# Patient Record
Sex: Female | Born: 1960 | Race: Black or African American | Hispanic: No | Marital: Married | Smoking: Never smoker
Health system: Southern US, Community
[De-identification: ages and names within clinical notes are randomized; demographics above are authoritative.]

## PROBLEM LIST (undated history)

## (undated) ENCOUNTER — Emergency Department (HOSPITAL_BASED_OUTPATIENT_CLINIC_OR_DEPARTMENT_OTHER): Admission: EM | Payer: Managed Care, Other (non HMO) | Source: Home / Self Care

## (undated) DIAGNOSIS — Z5189 Encounter for other specified aftercare: Secondary | ICD-10-CM

## (undated) DIAGNOSIS — A609 Anogenital herpesviral infection, unspecified: Secondary | ICD-10-CM

## (undated) DIAGNOSIS — J45909 Unspecified asthma, uncomplicated: Secondary | ICD-10-CM

## (undated) DIAGNOSIS — I10 Essential (primary) hypertension: Secondary | ICD-10-CM

## (undated) DIAGNOSIS — D649 Anemia, unspecified: Secondary | ICD-10-CM

## (undated) HISTORY — DX: Essential (primary) hypertension: I10

## (undated) HISTORY — PX: REDUCTION MAMMAPLASTY: SUR839

## (undated) HISTORY — DX: Unspecified asthma, uncomplicated: J45.909

## (undated) HISTORY — DX: Encounter for other specified aftercare: Z51.89

## (undated) HISTORY — PX: BREAST SURGERY: SHX581

## (undated) HISTORY — PX: COLONOSCOPY: SHX174

## (undated) HISTORY — DX: Anogenital herpesviral infection, unspecified: A60.9

---

## 1987-04-27 HISTORY — PX: LASER ABLATION OF THE CERVIX: SHX1949

## 1987-04-27 HISTORY — PX: TUBAL LIGATION: SHX77

## 1999-01-29 ENCOUNTER — Emergency Department (HOSPITAL_COMMUNITY): Admission: EM | Admit: 1999-01-29 | Discharge: 1999-01-29 | Payer: Self-pay | Admitting: Emergency Medicine

## 2000-05-19 ENCOUNTER — Other Ambulatory Visit: Admission: RE | Admit: 2000-05-19 | Discharge: 2000-05-19 | Payer: Self-pay | Admitting: Obstetrics and Gynecology

## 2001-05-22 ENCOUNTER — Encounter: Payer: Self-pay | Admitting: Internal Medicine

## 2001-05-22 ENCOUNTER — Ambulatory Visit (HOSPITAL_COMMUNITY): Admission: RE | Admit: 2001-05-22 | Discharge: 2001-05-22 | Payer: Self-pay | Admitting: Internal Medicine

## 2001-08-17 ENCOUNTER — Other Ambulatory Visit: Admission: RE | Admit: 2001-08-17 | Discharge: 2001-08-17 | Payer: Self-pay | Admitting: Obstetrics and Gynecology

## 2003-02-05 ENCOUNTER — Other Ambulatory Visit: Admission: RE | Admit: 2003-02-05 | Discharge: 2003-02-05 | Payer: Self-pay | Admitting: Obstetrics and Gynecology

## 2004-02-12 ENCOUNTER — Other Ambulatory Visit: Admission: RE | Admit: 2004-02-12 | Discharge: 2004-02-12 | Payer: Self-pay | Admitting: Obstetrics and Gynecology

## 2005-02-18 ENCOUNTER — Other Ambulatory Visit: Admission: RE | Admit: 2005-02-18 | Discharge: 2005-02-18 | Payer: Self-pay | Admitting: Obstetrics and Gynecology

## 2005-04-30 ENCOUNTER — Encounter: Admission: RE | Admit: 2005-04-30 | Discharge: 2005-04-30 | Payer: Self-pay | Admitting: Plastic Surgery

## 2006-02-22 ENCOUNTER — Other Ambulatory Visit: Admission: RE | Admit: 2006-02-22 | Discharge: 2006-02-22 | Payer: Self-pay | Admitting: Obstetrics and Gynecology

## 2006-05-27 ENCOUNTER — Encounter: Admission: RE | Admit: 2006-05-27 | Discharge: 2006-05-27 | Payer: Self-pay | Admitting: Obstetrics and Gynecology

## 2007-07-25 ENCOUNTER — Other Ambulatory Visit: Admission: RE | Admit: 2007-07-25 | Discharge: 2007-07-25 | Payer: Self-pay | Admitting: Obstetrics and Gynecology

## 2008-08-20 ENCOUNTER — Encounter: Payer: Self-pay | Admitting: Obstetrics and Gynecology

## 2008-08-20 ENCOUNTER — Other Ambulatory Visit: Admission: RE | Admit: 2008-08-20 | Discharge: 2008-08-20 | Payer: Self-pay | Admitting: Obstetrics and Gynecology

## 2008-08-20 ENCOUNTER — Ambulatory Visit: Payer: Self-pay | Admitting: Obstetrics and Gynecology

## 2008-08-27 ENCOUNTER — Ambulatory Visit: Payer: Self-pay | Admitting: Obstetrics and Gynecology

## 2008-09-03 ENCOUNTER — Encounter: Admission: RE | Admit: 2008-09-03 | Discharge: 2008-09-03 | Payer: Self-pay | Admitting: Obstetrics and Gynecology

## 2009-08-21 ENCOUNTER — Ambulatory Visit: Payer: Self-pay | Admitting: Obstetrics and Gynecology

## 2009-08-21 ENCOUNTER — Other Ambulatory Visit: Admission: RE | Admit: 2009-08-21 | Discharge: 2009-08-21 | Payer: Self-pay | Admitting: Obstetrics and Gynecology

## 2009-09-09 ENCOUNTER — Ambulatory Visit: Payer: Self-pay | Admitting: Obstetrics and Gynecology

## 2009-10-22 ENCOUNTER — Ambulatory Visit: Payer: Self-pay | Admitting: Obstetrics and Gynecology

## 2010-02-11 ENCOUNTER — Encounter: Admission: RE | Admit: 2010-02-11 | Discharge: 2010-02-11 | Payer: Self-pay | Admitting: Obstetrics and Gynecology

## 2010-03-11 ENCOUNTER — Ambulatory Visit: Payer: Self-pay | Admitting: Obstetrics and Gynecology

## 2010-03-16 ENCOUNTER — Ambulatory Visit: Payer: Self-pay | Admitting: Obstetrics and Gynecology

## 2010-09-03 ENCOUNTER — Encounter: Payer: Self-pay | Admitting: Obstetrics and Gynecology

## 2010-10-06 ENCOUNTER — Other Ambulatory Visit: Payer: Self-pay | Admitting: Obstetrics and Gynecology

## 2010-10-06 ENCOUNTER — Other Ambulatory Visit (HOSPITAL_COMMUNITY)
Admission: RE | Admit: 2010-10-06 | Discharge: 2010-10-06 | Disposition: A | Discharge status: Home / Self Care | Payer: 59 | Source: Ambulatory Visit | Attending: Obstetrics and Gynecology | Admitting: Obstetrics and Gynecology

## 2010-10-06 ENCOUNTER — Encounter (INDEPENDENT_AMBULATORY_CARE_PROVIDER_SITE_OTHER): Payer: 59 | Admitting: Obstetrics and Gynecology

## 2010-10-06 DIAGNOSIS — Z01419 Encounter for gynecological examination (general) (routine) without abnormal findings: Secondary | ICD-10-CM

## 2010-10-06 DIAGNOSIS — Z124 Encounter for screening for malignant neoplasm of cervix: Secondary | ICD-10-CM | POA: Insufficient documentation

## 2010-10-06 DIAGNOSIS — Z833 Family history of diabetes mellitus: Secondary | ICD-10-CM

## 2010-12-23 ENCOUNTER — Other Ambulatory Visit: Payer: Self-pay | Admitting: Obstetrics and Gynecology

## 2010-12-23 DIAGNOSIS — B373 Candidiasis of vulva and vagina: Secondary | ICD-10-CM

## 2011-05-17 ENCOUNTER — Other Ambulatory Visit: Payer: Self-pay | Admitting: Obstetrics and Gynecology

## 2011-05-17 DIAGNOSIS — Z1231 Encounter for screening mammogram for malignant neoplasm of breast: Secondary | ICD-10-CM

## 2011-05-31 ENCOUNTER — Ambulatory Visit: Payer: 59

## 2011-06-02 ENCOUNTER — Ambulatory Visit
Admission: RE | Admit: 2011-06-02 | Discharge: 2011-06-02 | Disposition: A | Discharge status: Home / Self Care | Payer: 59 | Source: Ambulatory Visit | Attending: Obstetrics and Gynecology | Admitting: Obstetrics and Gynecology

## 2011-06-02 DIAGNOSIS — Z1231 Encounter for screening mammogram for malignant neoplasm of breast: Secondary | ICD-10-CM

## 2011-07-16 ENCOUNTER — Other Ambulatory Visit: Payer: Self-pay | Admitting: Obstetrics and Gynecology

## 2011-08-19 ENCOUNTER — Other Ambulatory Visit: Payer: Self-pay | Admitting: Obstetrics and Gynecology

## 2011-08-27 ENCOUNTER — Telehealth: Payer: Self-pay | Admitting: *Deleted

## 2011-08-27 NOTE — Telephone Encounter (Signed)
Pt called requesting [facility] paper filled out due to her grandmother being sick.. Explained to pt that her grandmother doctor should fill out paper work. Pt will follow up with grandmothers PCP.

## 2011-09-28 ENCOUNTER — Other Ambulatory Visit: Payer: Self-pay | Admitting: Obstetrics and Gynecology

## 2011-09-29 ENCOUNTER — Other Ambulatory Visit: Payer: Self-pay | Admitting: Obstetrics and Gynecology

## 2011-10-26 ENCOUNTER — Other Ambulatory Visit: Payer: Self-pay | Admitting: Obstetrics and Gynecology

## 2011-11-15 ENCOUNTER — Other Ambulatory Visit: Payer: Self-pay | Admitting: Obstetrics and Gynecology

## 2011-11-16 NOTE — Telephone Encounter (Signed)
RGCE is scheduled for August 12, 201.

## 2011-12-06 ENCOUNTER — Encounter: Payer: 59 | Admitting: Obstetrics and Gynecology

## 2011-12-15 ENCOUNTER — Encounter: Payer: 59 | Admitting: Obstetrics and Gynecology

## 2011-12-22 ENCOUNTER — Encounter: Payer: Self-pay | Admitting: Obstetrics and Gynecology

## 2011-12-22 ENCOUNTER — Ambulatory Visit (INDEPENDENT_AMBULATORY_CARE_PROVIDER_SITE_OTHER): Payer: 59 | Admitting: Obstetrics and Gynecology

## 2011-12-22 VITALS — BP 130/76 | Ht 64.5 in | Wt 172.0 lb

## 2011-12-22 DIAGNOSIS — B373 Candidiasis of vulva and vagina: Secondary | ICD-10-CM

## 2011-12-22 DIAGNOSIS — Z01419 Encounter for gynecological examination (general) (routine) without abnormal findings: Secondary | ICD-10-CM

## 2011-12-22 DIAGNOSIS — D259 Leiomyoma of uterus, unspecified: Secondary | ICD-10-CM

## 2011-12-22 DIAGNOSIS — N898 Other specified noninflammatory disorders of vagina: Secondary | ICD-10-CM

## 2011-12-22 DIAGNOSIS — D219 Benign neoplasm of connective and other soft tissue, unspecified: Secondary | ICD-10-CM

## 2011-12-22 LAB — WET PREP FOR TRICH, YEAST, CLUE

## 2011-12-22 MED ORDER — MEDROXYPROGESTERONE ACETATE 5 MG PO TABS: ORAL_TABLET | ORAL | Status: DC

## 2011-12-22 MED ORDER — TERCONAZOLE 0.8 % VA CREA: TOPICAL_CREAM | VAGINAL | Status: DC

## 2011-12-22 MED ORDER — ESTRADIOL 1 MG PO TABS: 1.0000 mg | ORAL_TABLET | Freq: Every day | ORAL | Status: DC

## 2011-12-22 NOTE — Patient Instructions (Signed)
Schedule pelvic ultrasound

## 2011-12-22 NOTE — Progress Notes (Signed)
Patient came to see me today for her annual GYN exam. She remains on hormone replacement therapy with excellent results for her menopausal symptoms. She does have monthly withdrawal. She had a mammogram earlier this year. She had normal bone density in our office in 2011. 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 She has had yearly normal Pap smears since then. Her last Pap smear was 2012. She is fibroids and we've been watching. She just did lab work elsewhere. She is having vulvar and vaginal itching with discharge. She also notices bumps that come and go occasionally on her vulva. She is having no unusual bleeding or pelvic pain.  Physical examination:Kim Gardner present HEENT within normal limits. Neck: Thyroid not large. No masses. Supraclavicular nodes: not enlarged. Breasts: Examined in both sitting and lying  position. No skin changes and no masses. Abdomen: Soft no guarding rebound or masses or hernia. Pelvic: External: Within normal limits. BUS: Within normal limits. Vaginal:within normal limits.vaginal discharge consistent with yeast but negative wet prep.  Good estrogen effect. No evidence of cystocele rectocele or enterocele. Cervix: clean. Uterus: 10-11 weeks size fibroids.. Adnexa: No masses but it is very difficult to adequately evaluate due to lateral spread of fibroids. Rectovaginal exam: Confirmatory and negative. Extremities: Within normal limits.  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 Fibroids #3. Menopausal symptoms #4. Yeast vaginitis  Plan: Continue yearly mammograms. Continue HRT as above. Terconazole 3 cream. Ultrasound scheduled for adequate evaluation of ovaries.The new Pap smear guidelines were discussed with the patient. No pap done.

## 2011-12-23 LAB — URINALYSIS W MICROSCOPIC + REFLEX CULTURE
Crystals: NONE SEEN
Ketones, ur: NEGATIVE mg/dL
Leukocytes, UA: NEGATIVE
Nitrite: NEGATIVE
Specific Gravity, Urine: 1.017 (ref 1.005–1.030)
Urobilinogen, UA: 0.2 mg/dL (ref 0.0–1.0)

## 2011-12-31 ENCOUNTER — Ambulatory Visit: Payer: 59 | Admitting: Obstetrics and Gynecology

## 2011-12-31 ENCOUNTER — Other Ambulatory Visit: Payer: 59

## 2012-01-18 ENCOUNTER — Other Ambulatory Visit: Payer: Self-pay | Admitting: Obstetrics and Gynecology

## 2012-01-18 ENCOUNTER — Ambulatory Visit (INDEPENDENT_AMBULATORY_CARE_PROVIDER_SITE_OTHER): Payer: 59 | Admitting: Obstetrics and Gynecology

## 2012-01-18 ENCOUNTER — Ambulatory Visit (INDEPENDENT_AMBULATORY_CARE_PROVIDER_SITE_OTHER): Payer: 59

## 2012-01-18 DIAGNOSIS — D219 Benign neoplasm of connective and other soft tissue, unspecified: Secondary | ICD-10-CM

## 2012-01-18 DIAGNOSIS — D259 Leiomyoma of uterus, unspecified: Secondary | ICD-10-CM

## 2012-01-18 DIAGNOSIS — D251 Intramural leiomyoma of uterus: Secondary | ICD-10-CM

## 2012-01-18 DIAGNOSIS — N852 Hypertrophy of uterus: Secondary | ICD-10-CM

## 2012-01-18 DIAGNOSIS — B373 Candidiasis of vulva and vagina: Secondary | ICD-10-CM

## 2012-01-18 MED ORDER — FLUCONAZOLE 150 MG PO TABS: 150.0000 mg | ORAL_TABLET | Freq: Once | ORAL | Status: DC

## 2012-01-18 NOTE — Patient Instructions (Signed)
Pickup medication for yeast infection.

## 2012-01-18 NOTE — Progress Notes (Signed)
The patient came back today for ultrasound due to large fibroids making it difficult to feel her ovaries. On ultrasound her uterus is enlarged by multiple fibroids. 6 were seen. The 2 largest are  3 cm. They are slightly larger than her last ultrasound in 2011. Her endometrial echo is 6.4 mm. Both ovaries are seen and are normal. Her cul-de-sac is free of fluid. At her last office visit she was treated with terconazole for yeast vaginitis. She got temporarily but is still itching.  Assessment: #1. Fibroids #2. Vulvar itching  Plan: Reassured about her ovaries. Diflucan 150 mg daily for 7 days.

## 2012-08-28 ENCOUNTER — Other Ambulatory Visit: Payer: Self-pay | Admitting: Obstetrics and Gynecology

## 2012-09-19 ENCOUNTER — Other Ambulatory Visit: Payer: Self-pay

## 2012-09-19 DIAGNOSIS — Z1231 Encounter for screening mammogram for malignant neoplasm of breast: Secondary | ICD-10-CM

## 2012-09-27 ENCOUNTER — Ambulatory Visit
Admission: RE | Admit: 2012-09-27 | Discharge: 2012-09-27 | Disposition: A | Discharge status: Home / Self Care | Payer: BC Managed Care – PPO | Source: Ambulatory Visit

## 2012-09-27 DIAGNOSIS — Z1231 Encounter for screening mammogram for malignant neoplasm of breast: Secondary | ICD-10-CM

## 2012-10-20 ENCOUNTER — Other Ambulatory Visit: Payer: Self-pay | Admitting: Obstetrics and Gynecology

## 2013-01-05 ENCOUNTER — Other Ambulatory Visit: Payer: Self-pay | Admitting: Obstetrics and Gynecology

## 2013-01-07 ENCOUNTER — Other Ambulatory Visit: Payer: Self-pay | Admitting: Obstetrics and Gynecology

## 2013-01-10 ENCOUNTER — Ambulatory Visit (INDEPENDENT_AMBULATORY_CARE_PROVIDER_SITE_OTHER): Payer: BC Managed Care – PPO | Admitting: Emergency Medicine

## 2013-01-10 VITALS — BP 132/82 | HR 77 | Temp 97.9°F | Resp 18 | Ht 65.0 in | Wt 181.4 lb

## 2013-01-10 DIAGNOSIS — J209 Acute bronchitis, unspecified: Secondary | ICD-10-CM

## 2013-01-10 DIAGNOSIS — J309 Allergic rhinitis, unspecified: Secondary | ICD-10-CM

## 2013-01-10 MED ORDER — FLUTICASONE PROPIONATE 50 MCG/ACT NA SUSP: 2.0000 | Freq: Every day | NASAL | Status: DC

## 2013-01-10 MED ORDER — ALBUTEROL SULFATE HFA 108 (90 BASE) MCG/ACT IN AERS: 2.0000 | INHALATION_SPRAY | RESPIRATORY_TRACT | Status: DC | PRN

## 2013-01-10 NOTE — Progress Notes (Signed)
[facility] [address] [place] [address] [phone]  Date:  01/10/2013   Name:  Katelyn Salinas   DOB:  1960/06/27   MRN:  [id number]  PCP:  No primary provider on file.    Chief Complaint: Sore Throat, Nasal Congestion and Fatigue   History of Present Illness:  Katelyn Salinas is a 52 y.o. very pleasant female patient who presents with the following:  Ill for a "couple weeks"  With nasal congestion, sore throat, hoarseness and cough and wheezing that is worsening.  No fever or chills.  No nasal drainage that she has seen nor sputum production.  Says she has recently begun to snore and she feels that she has worsened.  Has arthralgias and myalgias.  No nausea or vomiting.  No improvement with over the counter medications or other home remedies.  Denies other complaint or health concern today.   There are no active problems to display for this patient.   Past Medical History  Diagnosis Date  . Cervical dysplasia   . Fibroid   . Adenomyosis   . Hypertension   . PID (acute pelvic inflammatory disease) 1994  . Blood transfusion without reported diagnosis     Past Surgical History  Procedure Laterality Date  . Tubal ligation    . Laser ablation of the cervix    . Breast surgery      RIGHT BREAST LUMP; BREAST REDUCTION IN 2006   . Colposcopy      History  Substance Use Topics  . Smoking status: Never Smoker   . Smokeless tobacco: Not on file  . Alcohol Use: No    Family History  Problem Relation Age of Onset  . Hypertension Mother   . Hypertension Father   . Heart disease Father   . Hypertension Brother   . Heart disease Brother     No Known Allergies  Medication list has been reviewed and updated.  Current Outpatient Prescriptions on File Prior to Visit  Medication Sig Dispense Refill  . estradiol (ESTRACE) 1 MG tablet TAKE 1 TABLET EVERY DAY  30 tablet  1  . estradiol (ESTRACE) 1 MG tablet TAKE 1 TABLET DAILY  30 tablet  0  . fish  oil-omega-3 fatty acids 1000 MG capsule Take 1 g by mouth daily.      . medroxyPROGESTERone (PROVERA) 5 MG tablet TAKE ONE TABLET DAILY FIRST 12 DAYS OF MONTH  12 tablet  0  . Multiple Vitamin (MULTIVITAMIN PO) Take by mouth.        . naproxen sodium (ANAPROX) 550 MG tablet TAKE 1 TABLET THREE TIMES A DAY  30 tablet  2  . terconazole (TERAZOL 3) 0.8 % vaginal cream One applicatorful at bedtime in vagina for 3 days  20 g  1  . Valsartan (DIOVAN PO) Take by mouth.        . fluconazole (DIFLUCAN) 150 MG tablet Take 1 tablet (150 mg total) by mouth once.  7 tablet  0  . vitamin E (VITAMIN E) 400 UNIT capsule Take 400 Units by mouth daily.         No current facility-administered medications on file prior to visit.    Review of Systems:  As per HPI, otherwise negative.   Physical Examination: Filed Vitals:   01/10/13 0824  BP: 132/82  Pulse: 77  Temp: 97.9 F (36.6 C)  Resp: 18   Filed Vitals:   01/10/13 0824  Height: 5\' 5"  (1.651 m)  Weight: 181 lb 6.4 oz (82.283 kg)   Body mass index is 30.19 kg/(m^2). Ideal Body Weight: Weight in (lb) to have BMI = 25: 149.9  GEN: WDWN, NAD, Non-toxic, A & O x 3 HEENT: Atraumatic, Normocephalic. Neck supple. No masses, No LAD. Ears and Nose: No external deformity.  Pallid swollen mucosa CV: RRR, No M/G/R. No JVD. No thrill. No extra heart sounds. PULM: CTA B, no wheezes, crackles, rhonchi. No retractions. No resp. distress. No accessory muscle use. ABD: S, NT, ND, +BS. No rebound. No HSM. EXTR: No c/c/e NEURO Normal gait.  PSYCH: Normally interactive. Conversant. Not depressed or anxious appearing.  Calm demeanor.    Assessment and Plan: Season allergic rhinitis Allergic bronchospasm Albuterol flonase  Signed,  Phillips Odor, MD

## 2013-01-10 NOTE — Patient Instructions (Addendum)
Metered Dose Inhaler (No Spacer Used) Inhaled medicines are the basis of asthma treatment and other breathing problems. Inhaled medicine can only be effective if used properly. Good technique assures that the medicine reaches the lungs. Metered dose inhalers (MDIs) are used to deliver a variety of inhaled medicines. These include quick relief medicines, controller medicines (such as corticosteroids), and cromolyn. The medicine is delivered by pushing down on a metal canister to release a set amount of spray.  If you are using different kinds of inhalers, use your quick relief medicine to open the airways 10 to 15 minutes before using a steroid. If you are unsure which inhalers to use and the order of using them, ask your caregiver, nurse, or respiratory therapist. HOW TO USE THE INHALER 1. Remove cap from inhaler. 2. Shake inhaler for 5 seconds before each inhalation (breathing in). 3. Position the inhaler so that the top of the canister faces up. 4. Put your index finger on the top of the medication canister. Your thumb supports the bottom of the inhaler. 5. Open your mouth. 6. Hold the inhaler 1 to 2 inches away from your open mouth. This allows the medicine to slow down before the medicine enters the mouth. 7. Exhale (breathe out) normally and as completely as possible. 8. Press the canister down with the index finger to release the medication. 9. At the same time as the canister is pressed, inhale deeply and slowly until the lungs are completely filled. This should take 4 to 6 seconds. Keep your tongue down. 10. Hold the medication in your lungs for up to 10 seconds (10 seconds is best). This helps the medicine get into the small airways of your lungs to work better. 11. Breathe out slowly, through pursed lips. Whistling is an example of pursed lips. 12. Wait at least 1 minute between puffs. Continue with the above steps until you have taken the number of puffs your caregiver has  ordered. 13. Replace cap on inhaler. AVOID:  Inhaling before or after starting the spray of medicine. It takes practice to coordinate your breathing with triggering the spray.  Inhaling through the nose (rather than the mouth) when triggering the spray. HOW TO DETERMINE IF YOUR INHALER IS FULL OR NEARLY EMPTY:  Determine when an inhaler is empty. You cannot know when an MDI canister is empty by shaking it. A few MDIs are now being made with dose counters. Ask your caregiver for a prescription that has a dose counter if you feel you need that extra help.  If your inhaler does not have a counter, check the number of doses in the inhaler before you use it. The canister or box will list the number of doses in the canister. Divide the total number of doses in the canister by the number you will use each day to find how many days the canister will last. (For example, if your canister has 200 doses and you take 2 puffs, 4 times each day, which is 8 puffs a day. Dividing 200 by 8 equals 25. The canister should last 25 days.) Using a calendar, count forward that many days to see when your inhaler will run out. Write the refill date on a calendar or your canister.  Remember, if you need to take extra doses, the inhaler will empty sooner than you figured. Be sure you have a refill before your canister runs out. Refill your inhaler 7 to 10 days before it runs out. HOME CARE INSTRUCTIONS   Do   not use the inhaler more than your caregiver tells you. If you are still wheezing and are feeling tightness in your chest, call your caregiver.  Keep an adequate supply of medication. This includes making sure the medicine is not expired, and you have a spare MDI.  Follow your caregiver or inhaler insert directions for cleaning the inhaler. SEEK MEDICAL CARE IF:   Symptoms are only partially relieved with your inhalers.  You are having trouble using your inhalers.  You experience some increase in phlegm.  You  develop a fever of 102 F (38.9 C). SEEK IMMEDIATE MEDICAL CARE IF:   You feel little or no relief with your inhalers. You are still wheezing and are feeling shortness of breath and/or tightness in your chest.  You have side effects such as dizziness, headaches, or fast heart rate.  You have chills, fever, night sweats or an oral temperature above 102 F (38.9 C) develops.  Phlegm production increases a lot, or there is blood in the phlegm. MAKE SURE YOU:   Understand these instructions.  Will watch your condition.  Will get help right away if you are not doing well or get worse. Document Released: 02/07/2007 Document Revised: 07/05/2011 Document Reviewed: 01/28/2009 [employer] Patient Information 2014 [employer].  

## 2013-01-16 ENCOUNTER — Ambulatory Visit (INDEPENDENT_AMBULATORY_CARE_PROVIDER_SITE_OTHER): Payer: BC Managed Care – PPO | Admitting: Gynecology

## 2013-01-16 ENCOUNTER — Encounter: Payer: Self-pay | Admitting: Gynecology

## 2013-01-16 ENCOUNTER — Telehealth: Payer: Self-pay | Admitting: *Deleted

## 2013-01-16 VITALS — BP 130/80 | Ht 64.5 in | Wt 179.0 lb

## 2013-01-16 DIAGNOSIS — D259 Leiomyoma of uterus, unspecified: Secondary | ICD-10-CM

## 2013-01-16 DIAGNOSIS — Z01419 Encounter for gynecological examination (general) (routine) without abnormal findings: Secondary | ICD-10-CM

## 2013-01-16 DIAGNOSIS — N951 Menopausal and female climacteric states: Secondary | ICD-10-CM

## 2013-01-16 DIAGNOSIS — Z79890 Hormone replacement therapy (postmenopausal): Secondary | ICD-10-CM

## 2013-01-16 DIAGNOSIS — Z87410 Personal history of cervical dysplasia: Secondary | ICD-10-CM

## 2013-01-16 DIAGNOSIS — Z23 Encounter for immunization: Secondary | ICD-10-CM

## 2013-01-16 DIAGNOSIS — B373 Candidiasis of vulva and vagina: Secondary | ICD-10-CM

## 2013-01-16 DIAGNOSIS — B3731 Acute candidiasis of vulva and vagina: Secondary | ICD-10-CM

## 2013-01-16 HISTORY — DX: Leiomyoma of uterus, unspecified: D25.9

## 2013-01-16 MED ORDER — TERCONAZOLE 0.8 % VA CREA: TOPICAL_CREAM | VAGINAL | Status: DC

## 2013-01-16 MED ORDER — MEDROXYPROGESTERONE ACETATE 5 MG PO TABS: ORAL_TABLET | ORAL | Status: DC

## 2013-01-16 MED ORDER — ESTRADIOL 1 MG PO TABS: ORAL_TABLET | ORAL | Status: DC

## 2013-01-16 MED ORDER — ESTRADIOL 2 MG PO TABS: ORAL_TABLET | ORAL | Status: DC

## 2013-01-16 NOTE — Telephone Encounter (Signed)
Pharmacy called regarding estradiol 2 mg that was sent today. I told him that your note said "increase Estrace to 1.5 mg daily" he said that the Rx should be estradiol 1 mg take 1 1/2 tablets daily #45 once daily. Pharmacist asked me to send this why, please approve.

## 2013-01-16 NOTE — Patient Instructions (Addendum)

## 2013-01-16 NOTE — Telephone Encounter (Signed)
That is correct 

## 2013-01-16 NOTE — Telephone Encounter (Signed)
rx sent to pharmacy

## 2013-01-16 NOTE — Addendum Note (Signed)
Addended by: Bertram Savin A on: 01/16/2013 11:39 AM   Modules accepted: Orders

## 2013-01-16 NOTE — Progress Notes (Signed)
Katelyn Salinas 1960/06/30 [id number]   History:    52 y.o.  for annual gyn exam who went into menopause approximately 2 years ago and has been on Estrace 1 mg daily with the addition of Provera 5 mg by mouth daily for 12 days of the month. Patient stating that her vasomotor symptoms especially night sweats in the evening have become unbearable and contributes to her insomnia. Patient has a normal like menstrual cycle every 28-30 days. 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 Her Pap smear subsequently had been normal. Patient does have history of fibroid uterus last years ultrasound demonstrated a somewhat slightly gotten bigger and has a total of 6 fibroids. Patient's last bone density study was in 2011. Patient's mammogram this year was normal as well. Patient has not had her colonoscopy. Patient has not received her Tdap vaccine yet. Seen yet. Her PCP is Dr.Kohut who has been drawing her lab work.  Past medical history,surgical history, family history and social history were all reviewed and documented in the EPIC chart.  Gynecologic History Patient's last menstrual period was 12/31/2012. Contraception: post menopausal status Last Pap: 2012. Results were: normal Last mammogram: 2014. Results were: normal  Obstetric History OB History  Gravida Para Term Preterm AB SAB TAB Ectopic Multiple Living  1 1 1       1     # Outcome Date GA Lbr Len/2nd Weight Sex Delivery Anes PTL Lv  1 TRM                ROS: A ROS was performed and pertinent positives and negatives are included in the history.  GENERAL: No fevers or chills. HEENT: No change in vision, no earache, sore throat or sinus congestion. NECK: No pain or stiffness. CARDIOVASCULAR: No chest pain or pressure. No palpitations. PULMONARY: No shortness of breath, cough or wheeze. GASTROINTESTINAL: No abdominal pain, nausea, vomiting or diarrhea, melena or  bright red blood per rectum. GENITOURINARY: No urinary frequency, urgency, hesitancy or dysuria. MUSCULOSKELETAL: No joint or muscle pain, no back pain, no recent trauma. DERMATOLOGIC: No rash, no itching, no lesions. ENDOCRINE: No polyuria, polydipsia, no heat or cold intolerance. No recent change in weight. HEMATOLOGICAL: No anemia or easy bruising or bleeding. NEUROLOGIC: No headache, seizures, numbness, tingling or weakness. PSYCHIATRIC: No depression, no loss of interest in normal activity or change in sleep pattern.     Exam: chaperone present  BP 130/80  Ht 5' 4.5" (1.638 m)  Wt 179 lb (81.194 kg)  BMI 30.26 kg/m2  LMP 12/31/2012  Body mass index is 30.26 kg/(m^2).  General appearance : Well developed well nourished female. No acute distress HEENT: Neck supple, trachea midline, no carotid bruits, no thyroidmegaly Lungs: Clear to auscultation, no rhonchi or wheezes, or rib retractions  Heart: Regular rate and rhythm, no murmurs or gallops Breast:Examined in sitting and supine position were symmetrical in appearance, no palpable masses or tenderness,  no skin retraction, no nipple inversion, no nipple discharge, no skin discoloration, no axillary or supraclavicular lymphadenopathy Abdomen: no palpable masses or tenderness, no rebound or guarding Extremities: no edema or skin discoloration or tenderness  Pelvic:  Bartholin, Urethra, Skene Glands: Within normal limits             Vagina: No gross lesions or discharge  Cervix: No gross lesions or discharge  Uterus   Irregular shaped 10 week size  Adnexa  Difficult  to assess due to the uterine size  Anus and perineum  normal   Rectovaginal  normal sphincter tone without palpated masses or tenderness             Hemoccult course provided     Assessment/Plan:  52 y.o. female for annual exam with history of fibroid uterus. Today's pelvic exam incomplete due to size of uterus. Patient returned to the office in the next several weeks  for an ultrasound. Because of patient's worsening vasomotor symptoms and we will increase her Estrace to 1.5 mg daily with the addition of Provera 10 mg daily for 10 days of the month. We discussed the importance of calcium and vitamin D in regular exercise for osteoporosis prevention. She will need a bone density study next year. Pap smear not done today new guidelines were discussed. She was given Hemoccult card system into the office for testing. She was reminded to schedule her colonoscopy this year. Patient received a Tdap vaccine today and literature information was provided.    Ok Edwards MD, 11:32 AM 01/16/2013

## 2013-01-29 DIAGNOSIS — Z0271 Encounter for disability determination: Secondary | ICD-10-CM

## 2013-03-30 ENCOUNTER — Other Ambulatory Visit: Payer: Self-pay | Admitting: Emergency Medicine

## 2013-03-30 DIAGNOSIS — N63 Unspecified lump in unspecified breast: Secondary | ICD-10-CM

## 2013-03-30 DIAGNOSIS — N644 Mastodynia: Secondary | ICD-10-CM

## 2013-04-02 ENCOUNTER — Other Ambulatory Visit: Payer: Self-pay | Admitting: Endocrinology

## 2013-04-02 DIAGNOSIS — N644 Mastodynia: Secondary | ICD-10-CM

## 2013-04-02 DIAGNOSIS — N63 Unspecified lump in unspecified breast: Secondary | ICD-10-CM

## 2013-04-03 ENCOUNTER — Ambulatory Visit
Admission: RE | Admit: 2013-04-03 | Discharge: 2013-04-03 | Disposition: A | Discharge status: Home / Self Care | Payer: BC Managed Care – PPO | Source: Ambulatory Visit | Attending: Emergency Medicine | Admitting: Emergency Medicine

## 2013-04-03 ENCOUNTER — Other Ambulatory Visit: Payer: Self-pay | Admitting: Endocrinology

## 2013-04-03 DIAGNOSIS — N63 Unspecified lump in unspecified breast: Secondary | ICD-10-CM

## 2013-04-03 DIAGNOSIS — N644 Mastodynia: Secondary | ICD-10-CM

## 2013-04-04 ENCOUNTER — Ambulatory Visit (INDEPENDENT_AMBULATORY_CARE_PROVIDER_SITE_OTHER): Payer: BC Managed Care – PPO

## 2013-04-04 ENCOUNTER — Ambulatory Visit (INDEPENDENT_AMBULATORY_CARE_PROVIDER_SITE_OTHER): Payer: BC Managed Care – PPO | Admitting: Gynecology

## 2013-04-04 ENCOUNTER — Telehealth (INDEPENDENT_AMBULATORY_CARE_PROVIDER_SITE_OTHER): Payer: Self-pay

## 2013-04-04 ENCOUNTER — Other Ambulatory Visit: Payer: Self-pay | Admitting: Gynecology

## 2013-04-04 DIAGNOSIS — D259 Leiomyoma of uterus, unspecified: Secondary | ICD-10-CM

## 2013-04-04 DIAGNOSIS — N83209 Unspecified ovarian cyst, unspecified side: Secondary | ICD-10-CM

## 2013-04-04 DIAGNOSIS — D251 Intramural leiomyoma of uterus: Secondary | ICD-10-CM

## 2013-04-04 DIAGNOSIS — N852 Hypertrophy of uterus: Secondary | ICD-10-CM

## 2013-04-04 DIAGNOSIS — N83201 Unspecified ovarian cyst, right side: Secondary | ICD-10-CM

## 2013-04-04 DIAGNOSIS — N8300 Follicular cyst of ovary, unspecified side: Secondary | ICD-10-CM

## 2013-04-04 HISTORY — DX: Unspecified ovarian cyst, right side: N83.201

## 2013-04-04 NOTE — Progress Notes (Signed)
   Patient is a 52 year old who was seen in the office for the first time in over 2 year time span for annual gynecological exam. Patient was complaining of vasomotor symptoms and her estrogen was increased from 1 mg to 1.5 mg daily with the addition of Provera 10 mg for 10 days of the month which he states that since that last office visit this has helped tremendously and is working well. She was asked to come in today for followup ultrasound since she does have a history of a fibroid uterus. She has otherwise been asymptomatic.  Ultrasound today: Uterus measured 10.7 x 7.3 x 6.9 cm with an endometrial stripe of 10.4 mm (patient on HRT). Patient with 5 small fibroids the largest one measuring 29.3 mm. Right ovary small follicle measuring 22 x 22 x 21 mm echo for a period left ovary was normal.  Assessment/plan: Patient with uterine fibroids appear to have started to decrease in comparison with prior ultrasound in 2013. A small benign appearing ovarian follicle which was discussed with the patient reassurance given. She'll return back to the office next year for her annual exam or when necessary.

## 2013-04-04 NOTE — Telephone Encounter (Signed)
Called and left message for pt to call me.  She is scheduled to see Dr Derrell Lolling tomorrow.  I saw she is scheduled for a needle bx 12/18.  She can still come in tomorrow 1st or wait until after bx.

## 2013-04-05 ENCOUNTER — Encounter (INDEPENDENT_AMBULATORY_CARE_PROVIDER_SITE_OTHER): Payer: Self-pay | Admitting: General Surgery

## 2013-04-05 ENCOUNTER — Ambulatory Visit (INDEPENDENT_AMBULATORY_CARE_PROVIDER_SITE_OTHER): Payer: BC Managed Care – PPO | Admitting: General Surgery

## 2013-04-05 VITALS — BP 128/72 | HR 60 | Temp 98.0°F | Resp 18 | Ht 65.0 in | Wt 182.0 lb

## 2013-04-05 DIAGNOSIS — R2231 Localized swelling, mass and lump, right upper limb: Secondary | ICD-10-CM

## 2013-04-05 DIAGNOSIS — R2230 Localized swelling, mass and lump, unspecified upper limb: Secondary | ICD-10-CM | POA: Insufficient documentation

## 2013-04-05 DIAGNOSIS — R229 Localized swelling, mass and lump, unspecified: Secondary | ICD-10-CM

## 2013-04-05 NOTE — Patient Instructions (Signed)
The small swollen area in your right axilla feels soft like benign fatty tissue, or less likely, benign breast tissue. It does not feel like an abnormal lymph node and does not feel like a malignancy.  I agree with the scheduled image guided biopsy.  Return to see Dr. Derrell Lolling in 2-3 weeks for discussion of biopsy report

## 2013-04-05 NOTE — Progress Notes (Signed)
Patient ID: Katelyn Salinas, female   DOB: 1960-07-19, 52 y.o.   MRN: [id number]  Chief Complaint  Patient presents with  . New Evaluation    rt axillary swellling    HPI Katelyn Salinas is a 52 y.o. female.  She is referred by Dr. Maurine Minister, for evaluation of a right axillary mass.  The patient gives a six-month history of a tender swelling in the right axilla. She says that the tenderness waxes and wanes but the size of the lump has stayed about the same. No prior history of this. Recent right mammogram and ultrasound suggested there might be some right axillary breast tissue, possibly ectopic, or just adipose tissue. She is scheduled for and image guided biopsy of the right axilla mass by the radiologist on December 18.  She is taking hormone replacement therapy, and this is cyclical and she has menstrual periods. She is followed by Dr. Lily Peer. She has had one pregnancy and one delivery and also has some stepchildren.  She's had bilateral breast reductions by Dr. Stephens November  in 2007 and she had a cyst excised from her right breast in the past. She has mild asthma and hypertension.  History reveals a paternal great grandmother that had breast cancer but no other family history of breast or ovarian cancer.  She denies use of tobacco or alcohol. She is married.  HPI  Past Medical History  Diagnosis Date  . Cervical dysplasia   . Fibroid   . Adenomyosis   . Hypertension   . PID (acute pelvic inflammatory disease) 1994  . Blood transfusion without reported diagnosis   . Asthma     Past Surgical History  Procedure Laterality Date  . Tubal ligation    . Laser ablation of the cervix    . Breast surgery      RIGHT BREAST LUMP; BREAST REDUCTION IN 2006   . Colposcopy      Family History  Problem Relation Age of Onset  . Hypertension Mother   . Hypertension Father   . Heart disease Father   . Hypertension Brother   . Heart disease Brother     Social History History  Substance  Use Topics  . Smoking status: Never Smoker   . Smokeless tobacco: Not on file  . Alcohol Use: No    No Known Allergies  Current Outpatient Prescriptions  Medication Sig Dispense Refill  . albuterol (PROVENTIL HFA;VENTOLIN HFA) 108 (90 BASE) MCG/ACT inhaler Inhale 2 puffs into the lungs every 4 (four) hours as needed for wheezing (cough, shortness of breath or wheezing.).  1 Inhaler  1  . cephALEXin (KEFLEX) 500 MG capsule Take 500 mg by mouth 4 (four) times daily.      . citalopram (CELEXA) 20 MG tablet Take 20 mg by mouth daily.      Marland Kitchen estradiol (ESTRACE) 1 MG tablet Take 1 1/2 tablets daily by mouth  45 tablet  11  . fish oil-omega-3 fatty acids 1000 MG capsule Take 1 g by mouth daily.      . fluticasone (FLONASE) 50 MCG/ACT nasal spray Place 2 sprays into the nose daily.  16 g  12  . medroxyPROGESTERone (PROVERA) 5 MG tablet Take one daily for 10 days of the month  30 tablet  4  . Multiple Vitamin (MULTIVITAMIN PO) Take by mouth.        . naproxen sodium (ANAPROX) 550 MG tablet TAKE 1 TABLET THREE TIMES A DAY  30 tablet  2  . tacrolimus (PROGRAF)  0.5 MG capsule Take 0.5 mg by mouth 2 (two) times daily.      Marland Kitchen terconazole (TERAZOL 3) 0.8 % vaginal cream One applicatorful at bedtime in vagina for 3 days  20 g  1  . Valsartan (DIOVAN PO) Take by mouth.        . vitamin E (VITAMIN E) 400 UNIT capsule Take 400 Units by mouth daily.        . fluconazole (DIFLUCAN) 150 MG tablet Take 1 tablet (150 mg total) by mouth once.  7 tablet  0   No current facility-administered medications for this visit.    Review of Systems Review of Systems  Constitutional: Negative for fever, chills and unexpected weight change.  HENT: Negative for congestion, hearing loss, sore throat, trouble swallowing and voice change.   Eyes: Negative for visual disturbance.  Respiratory: Negative for cough and wheezing.   Cardiovascular: Negative for chest pain, palpitations and leg swelling.  Gastrointestinal:  Negative for nausea, vomiting, abdominal pain, diarrhea, constipation, blood in stool, abdominal distention and anal bleeding.  Genitourinary: Negative for hematuria, vaginal bleeding and difficulty urinating.  Musculoskeletal: Negative for arthralgias.  Skin: Negative for rash and wound.  Neurological: Negative for seizures, syncope and headaches.  Hematological: Negative for adenopathy. Does not bruise/bleed easily.  Psychiatric/Behavioral: Negative for confusion.    Blood pressure 128/72, pulse 60, temperature 98 F (36.7 C), resp. rate 18, height 5\' 5"  (1.651 m), weight 182 lb (82.555 kg), last menstrual period 03/24/2013.  Physical Exam Physical Exam  Constitutional: She is oriented to person, place, and time. She appears well-developed and well-nourished. No distress.  HENT:  Head: Normocephalic and atraumatic.  Nose: Nose normal.  Mouth/Throat: No oropharyngeal exudate.  Eyes: Conjunctivae and EOM are normal. Pupils are equal, round, and reactive to light. Left eye exhibits no discharge. No scleral icterus.  Neck: Neck supple. No JVD present. No tracheal deviation present. No thyromegaly present.  Cardiovascular: Normal rate, regular rhythm, normal heart sounds and intact distal pulses.   No murmur heard. Pulmonary/Chest: Effort normal and breath sounds normal. No respiratory distress. She has no wheezes. She has no rales. She exhibits no tenderness.    Small soft tissue mass right axilla, high. This is not very impressive by size or texture. Feels like adipose tissue. Theoretically could be atopic breast tissue. Does not feel like pathologic adenopathy. Overlying skin is healthy. It is a little tender.  Abdominal: Soft. Bowel sounds are normal. She exhibits no distension and no mass. There is no tenderness. There is no rebound and no guarding.  Musculoskeletal: She exhibits no edema and no tenderness.  Lymphadenopathy:    She has no cervical adenopathy.  Neurological: She is  alert and oriented to person, place, and time. She exhibits normal muscle tone. Coordination normal.  Skin: Skin is warm. No rash noted. She is not diaphoretic. No erythema. No pallor.  Psychiatric: She has a normal mood and affect. Her behavior is normal. Judgment and thought content normal.    Data Reviewed Dr. Marylen Ponto office notes. Mammogram and ultrasound  Assessment    Right axillary mass. This feels like benign adipose tissue or benign ectopic breast tissue. History is more consistent with lipoma.  History bilateral breast reduction  History right breast cyst excision.  Hypertension  Mild asthma     Plan    I discussed the differential diagnosis with the patient. I told her that this was a relatively low risk finding. I advised against any surgical intervention at this  point in time.  She will return to see me after she gets her biopsy we can discuss the histology and its implications.        Angelia Mould. Derrell Lolling, M.D., [facility] General and Minimally invasive Surgery Breast and Colorectal Surgery Office:   [phone] Pager:   [phone]  04/05/2013, 10:14 AM

## 2013-04-12 ENCOUNTER — Other Ambulatory Visit: Payer: Self-pay | Admitting: Endocrinology

## 2013-04-12 ENCOUNTER — Ambulatory Visit
Admission: RE | Admit: 2013-04-12 | Discharge: 2013-04-12 | Disposition: A | Discharge status: Home / Self Care | Payer: BC Managed Care – PPO | Source: Ambulatory Visit | Attending: Endocrinology | Admitting: Endocrinology

## 2013-04-12 DIAGNOSIS — N644 Mastodynia: Secondary | ICD-10-CM

## 2013-04-12 DIAGNOSIS — N63 Unspecified lump in unspecified breast: Secondary | ICD-10-CM

## 2013-04-12 HISTORY — PX: BREAST BIOPSY: SHX20

## 2013-04-16 ENCOUNTER — Ambulatory Visit (INDEPENDENT_AMBULATORY_CARE_PROVIDER_SITE_OTHER): Payer: BC Managed Care – PPO | Admitting: General Surgery

## 2013-04-16 ENCOUNTER — Encounter (INDEPENDENT_AMBULATORY_CARE_PROVIDER_SITE_OTHER): Payer: Self-pay | Admitting: General Surgery

## 2013-04-16 VITALS — BP 136/82 | HR 64 | Temp 97.4°F | Resp 14 | Ht 65.0 in | Wt 181.8 lb

## 2013-04-16 DIAGNOSIS — R2231 Localized swelling, mass and lump, right upper limb: Secondary | ICD-10-CM

## 2013-04-16 DIAGNOSIS — R229 Localized swelling, mass and lump, unspecified: Secondary | ICD-10-CM

## 2013-04-16 NOTE — Progress Notes (Signed)
Patient ID: Katelyn Salinas, female   DOB: Jan 21, 1961, 52 y.o.   MRN: [id number] History: This patient returns following image guided biopsy of right axillary mass. Right axillary mass pathology shows benign breast tissue. This is an ectopic breast tissue. I discussed this with the patient in detail. I gave her a copy of the pathology report. She was informed that this might regress if she stopped her hormone therapy. I told her that there was no indication for surgery at this time. She is essentially asymptomatic.  Past history, family history, social history, and review of systems her doctor in normal chart, unchanged, and noncontributory except as described above  Exam: The patient is in no distress today. Alert. Mental status normal. I did no further exam but we spent 20 minutes discussing the problem  Assessment: Right axillary mass, proven to be benign ectopic breast tissue on image guided biopsy History bilateral breast reduction History right breast cyst excision Hypertension Mild asthma  Plan: No surgical intervention planned Patient will consider discontinuing hormone therapy Annual mammograms recommended Return to see me as needed.   Angelia Mould. Derrell Lolling, M.D., [facility] General and Minimally invasive Surgery Breast and Colorectal Surgery Office:   [phone] Pager:   [phone]

## 2013-04-16 NOTE — Patient Instructions (Signed)
Biopsy of the tissue in your right axilla shows benign breast tissue. This means that you have a small ectopic [place] of breast tissue. This should not cause any problems.  If you stop the hormone therapy, this may get smaller.  Dr. Derrell Lolling advises no further surgery at this point in time. If this area becomes painful or enlarges, return to see Dr. Derrell Lolling for further evaluation.  Be sure to get annual mammograms

## 2013-04-26 ENCOUNTER — Other Ambulatory Visit: Payer: Self-pay | Admitting: Women's Health

## 2013-07-18 ENCOUNTER — Other Ambulatory Visit: Payer: Self-pay | Admitting: Women's Health

## 2013-12-27 ENCOUNTER — Other Ambulatory Visit: Payer: Self-pay | Admitting: Women's Health

## 2014-01-03 ENCOUNTER — Other Ambulatory Visit: Payer: Self-pay | Admitting: Women's Health

## 2014-02-01 ENCOUNTER — Encounter: Payer: Self-pay | Admitting: Gynecology

## 2014-02-01 ENCOUNTER — Other Ambulatory Visit (HOSPITAL_COMMUNITY)
Admission: RE | Admit: 2014-02-01 | Discharge: 2014-02-01 | Disposition: A | Discharge status: Home / Self Care | Payer: BC Managed Care – PPO | Source: Ambulatory Visit | Attending: Gynecology | Admitting: Gynecology

## 2014-02-01 ENCOUNTER — Ambulatory Visit (INDEPENDENT_AMBULATORY_CARE_PROVIDER_SITE_OTHER): Payer: BC Managed Care – PPO | Admitting: Gynecology

## 2014-02-01 VITALS — BP 122/78 | Ht 65.0 in | Wt 177.0 lb

## 2014-02-01 DIAGNOSIS — Z01419 Encounter for gynecological examination (general) (routine) without abnormal findings: Secondary | ICD-10-CM | POA: Insufficient documentation

## 2014-02-01 DIAGNOSIS — Z79890 Hormone replacement therapy (postmenopausal): Secondary | ICD-10-CM

## 2014-02-01 DIAGNOSIS — N951 Menopausal and female climacteric states: Secondary | ICD-10-CM

## 2014-02-01 DIAGNOSIS — Z124 Encounter for screening for malignant neoplasm of cervix: Secondary | ICD-10-CM

## 2014-02-01 DIAGNOSIS — Z1151 Encounter for screening for human papillomavirus (HPV): Secondary | ICD-10-CM | POA: Insufficient documentation

## 2014-02-01 DIAGNOSIS — D251 Intramural leiomyoma of uterus: Secondary | ICD-10-CM

## 2014-02-01 MED ORDER — ESTRADIOL 1 MG PO TABS: ORAL_TABLET | ORAL | Status: DC

## 2014-02-01 MED ORDER — MEDROXYPROGESTERONE ACETATE 10 MG PO TABS: 10.0000 mg | ORAL_TABLET | Freq: Every day | ORAL | Status: DC

## 2014-02-01 NOTE — Patient Instructions (Signed)
You may obtain a copy of any labs that were done today by logging onto MyChart as outlined in the instructions provided with your AVS (after visit summary). The office will not call with normal lab results but certainly if there are any significant abnormalities then we will contact you.   Health Maintenance, Female A healthy lifestyle and preventative care can promote health and wellness.  Maintain regular health, dental, and eye exams.  Eat a healthy diet. Foods like vegetables, fruits, whole grains, low-fat dairy products, and lean protein foods contain the nutrients you need without too many calories. Decrease your intake of foods high in solid fats, added sugars, and salt. Get information about a proper diet from your caregiver, if necessary.  Regular physical exercise is one of the most important things you can do for your health. Most adults should get at least 150 minutes of moderate-intensity exercise (any activity that increases your heart rate and causes you to sweat) each week. In addition, most adults need muscle-strengthening exercises on 2 or more days a week.   Maintain a healthy weight. The body mass index (BMI) is a screening tool to identify possible weight problems. It provides an estimate of body fat based on height and weight. Your caregiver can help determine your BMI, and can help you achieve or maintain a healthy weight. For adults 20 years and older:  A BMI below 18.5 is considered underweight.  A BMI of 18.5 to 24.9 is normal.  A BMI of 25 to 29.9 is considered overweight.  A BMI of 30 and above is considered obese.  Maintain normal blood lipids and cholesterol by exercising and minimizing your intake of saturated fat. Eat a balanced diet with plenty of fruits and vegetables. Blood tests for lipids and cholesterol should begin at age 61 and be repeated every 5 years. If your lipid or cholesterol levels are high, you are over 50, or you are a high risk for heart  disease, you may need your cholesterol levels checked more frequently.Ongoing high lipid and cholesterol levels should be treated with medicines if diet and exercise are not effective.  If you smoke, find out from your caregiver how to quit. If you do not use tobacco, do not start.  Lung cancer screening is recommended for adults aged 33 80 years who are at high risk for developing lung cancer because of a history of smoking. Yearly low-dose computed tomography (CT) is recommended for people who have at least a 30-pack-year history of smoking and are a current smoker or have quit within the past 15 years. A pack year of smoking is smoking an average of 1 pack of cigarettes a day for 1 year (for example: 1 pack a day for 30 years or 2 packs a day for 15 years). Yearly screening should continue until the smoker has stopped smoking for at least 15 years. Yearly screening should also be stopped for people who develop a health problem that would prevent them from having lung cancer treatment.  If you are pregnant, do not drink alcohol. If you are breastfeeding, be very cautious about drinking alcohol. If you are not pregnant and choose to drink alcohol, do not exceed 1 drink per day. One drink is considered to be 12 ounces (355 mL) of beer, 5 ounces (148 mL) of wine, or 1.5 ounces (44 mL) of liquor.  Avoid use of street drugs. Do not share needles with anyone. Ask for help if you need support or instructions about stopping  the use of drugs.  High blood pressure causes heart disease and increases the risk of stroke. Blood pressure should be checked at least every 1 to 2 years. Ongoing high blood pressure should be treated with medicines, if weight loss and exercise are not effective.  If you are 59 to 52 years old, ask your caregiver if you should take aspirin to prevent strokes.  Diabetes screening involves taking a blood sample to check your fasting blood sugar level. This should be done once every 3  years, after age 91, if you are within normal weight and without risk factors for diabetes. Testing should be considered at a younger age or be carried out more frequently if you are overweight and have at least 1 risk factor for diabetes.  Breast cancer screening is essential preventative care for women. You should practice "breast self-awareness." This means understanding the normal appearance and feel of your breasts and may include breast self-examination. Any changes detected, no matter how small, should be reported to a caregiver. Women in their 66s and 30s should have a clinical breast exam (CBE) by a caregiver as part of a regular health exam every 1 to 3 years. After age 101, women should have a CBE every year. Starting at age 100, women should consider having a mammogram (breast X-ray) every year. Women who have a family history of breast cancer should talk to their caregiver about genetic screening. Women at a high risk of breast cancer should talk to their caregiver about having an MRI and a mammogram every year.  Breast cancer gene (BRCA)-related cancer risk assessment is recommended for women who have family members with BRCA-related cancers. BRCA-related cancers include breast, ovarian, tubal, and peritoneal cancers. Having family members with these cancers may be associated with an increased risk for harmful changes (mutations) in the breast cancer genes BRCA1 and BRCA2. Results of the assessment will determine the need for genetic counseling and BRCA1 and BRCA2 testing.  The Pap test is a screening test for cervical cancer. Women should have a Pap test starting at age 57. Between ages 25 and 35, Pap tests should be repeated every 2 years. Beginning at age 37, you should have a Pap test every 3 years as long as the past 3 Pap tests have been normal. If you had a hysterectomy for a problem that was not cancer or a condition that could lead to cancer, then you no longer need Pap tests. If you are  between ages 50 and 76, and you have had normal Pap tests going back 10 years, you no longer need Pap tests. If you have had past treatment for cervical cancer or a condition that could lead to cancer, you need Pap tests and screening for cancer for at least 20 years after your treatment. If Pap tests have been discontinued, risk factors (such as a new sexual partner) need to be reassessed to determine if screening should be resumed. Some women have medical problems that increase the chance of getting cervical cancer. In these cases, your caregiver may recommend more frequent screening and Pap tests.  The human papillomavirus (HPV) test is an additional test that may be used for cervical cancer screening. The HPV test looks for the virus that can cause the cell changes on the cervix. The cells collected during the Pap test can be tested for HPV. The HPV test could be used to screen women aged 44 years and older, and should be used in women of any age  who have unclear Pap test results. After the age of 55, women should have HPV testing at the same frequency as a Pap test.  Colorectal cancer can be detected and often prevented. Most routine colorectal cancer screening begins at the age of 44 and continues through age 20. However, your caregiver may recommend screening at an earlier age if you have risk factors for colon cancer. On a yearly basis, your caregiver may provide home test kits to check for hidden blood in the stool. Use of a small camera at the end of a tube, to directly examine the colon (sigmoidoscopy or colonoscopy), can detect the earliest forms of colorectal cancer. Talk to your caregiver about this at age 86, when routine screening begins. Direct examination of the colon should be repeated every 5 to 10 years through age 13, unless early forms of pre-cancerous polyps or small growths are found.  Hepatitis C blood testing is recommended for all people born from [birth year] through 1965 and any  individual with known risks for hepatitis C.  Practice safe sex. Use condoms and avoid high-risk sexual practices to reduce the spread of sexually transmitted infections (STIs). Sexually active women aged 36 and younger should be checked for Chlamydia, which is a common sexually transmitted infection. Older women with new or multiple partners should also be tested for Chlamydia. Testing for other STIs is recommended if you are sexually active and at increased risk.  Osteoporosis is a disease in which the bones lose minerals and strength with aging. This can result in serious bone fractures. The risk of osteoporosis can be identified using a bone density scan. Women ages 20 and over and women at risk for fractures or osteoporosis should discuss screening with their caregivers. Ask your caregiver whether you should be taking a calcium supplement or vitamin D to reduce the rate of osteoporosis.  Menopause can be associated with physical symptoms and risks. Hormone replacement therapy is available to decrease symptoms and risks. You should talk to your caregiver about whether hormone replacement therapy is right for you.  Use sunscreen. Apply sunscreen liberally and repeatedly throughout the day. You should seek shade when your shadow is shorter than you. Protect yourself by wearing long sleeves, pants, a wide-brimmed hat, and sunglasses year round, whenever you are outdoors.  Notify your caregiver of new moles or changes in moles, especially if there is a change in shape or color. Also notify your caregiver if a mole is larger than the size of a pencil eraser.  Stay current with your immunizations. Document Released: 10/26/2010 Document Revised: 08/07/2012 Document Reviewed: 10/26/2010 [facility] Patient Information 2014 [employer].

## 2014-02-01 NOTE — Addendum Note (Signed)
Addended by: Nelva Nay on: 02/01/2014 12:01 PM   Modules accepted: Orders

## 2014-02-01 NOTE — Progress Notes (Signed)
Katelyn Salinas 03/12/1961 [id number]        53 y.o.  G1P1001 for annual exam.  Former patient Dr. Cherylann Banas and Dr. Toney Rakes. Several issues noted below.  Past medical history,surgical history, problem list, medications, allergies, family history and social history were all reviewed and documented as reviewed in the EPIC chart.  ROS:  12 system ROS performed with pertinent positives and negatives included in the history, assessment and plan.   Additional significant findings :  none   Exam: Kim Counsellor Vitals:   02/01/14 1114  BP: 122/78  Height: 5\' 5"  (1.651 m)  Weight: 177 lb (80.287 kg)   General appearance:  Normal affect, orientation and appearance. Skin: Grossly normal HEENT: Without gross lesions.  No cervical or supraclavicular adenopathy. Thyroid normal.  Lungs:  Clear without wheezing, rales or rhonchi Cardiac: RR, without RMG Abdominal:  Soft, nontender, without masses, guarding, rebound, organomegaly or hernia Breasts:  Examined lying and sitting without masses, retractions, discharge or axillary adenopathy. Pelvic:  Ext/BUS/vagina normal  Cervix normal  Uterus bulky, midline and mobile nontender consistent with her history of leiomyoma  Adnexa  Without masses or tenderness    Anus and perineum  Normal   Rectovaginal  Normal sphincter tone without palpated masses or tenderness.    Assessment/Plan:  53 y.o. G54P1001 female for annual exam.   1. Perimenopausal/HRT. Patient started on HRT due to significant hot flashes and sweats even while she was still menstruating. She's done well with this and wants to continue for now. She is taking estradiol 1.5 mg at bedtime and Provera 5 mg x10 days each month with withdrawal bleed at the end of this. Reviewed the dose of Provera with her my preference to increase it to 10 mg x12 days.  I reviewed the whole issue of HRT with her to include the WHI study with increased risk of stroke, heart attack, DVT and breast cancer. The  ACOG and NAMS statements for lowest dose for the shortest period of time reviewed. Transdermal versus oral first-pass effect benefit discussed.  Patient's comfortable with continuing I refilled her x1 year. Estrace 1.5 mg daily, Provera 10 mg first 12 days each month. Report any bleeding other than at the end of her Provera. 2. Leiomyoma. Ultrasound last year demonstrated multiple small myomas. Her exam appears to be stable with a bulky feeling uterus. She's asymptomatic without pressure bowel or bladder symptoms. Will continue to monitor with annual exams. 3. Pap smear 2012. Pap/HPV today. 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 Sandrea Hughs note. Repeat at 3-5 your enterable assuming this Pap smear is normal per current screening guidelines. 4. Mammography 03/2013. Repeat mammography this coming December. SBE monthly reviewed. 5. DEXA 2011 normal. Repeat at 61. Increase calcium vitamin D reviewed. 6. Colonoscopy 10 years ago. Recommend following up and repeating colonoscopy this coming year and she agrees to arrange. 7. Health maintenance. No routine blood work done as she reports this done at her primary physician's office. Follow up 1 year, sooner as needed.     Anastasio Auerbach MD, 11:53 AM 02/01/2014

## 2014-02-02 LAB — URINALYSIS W MICROSCOPIC + REFLEX CULTURE
BACTERIA UA: NONE SEEN
Bilirubin Urine: NEGATIVE
Casts: NONE SEEN
Crystals: NONE SEEN
Glucose, UA: NEGATIVE mg/dL
Hgb urine dipstick: NEGATIVE
Ketones, ur: NEGATIVE mg/dL
Leukocytes, UA: NEGATIVE
NITRITE: NEGATIVE
Protein, ur: NEGATIVE mg/dL
SPECIFIC GRAVITY, URINE: 1.017 (ref 1.005–1.030)
SQUAMOUS EPITHELIAL / LPF: NONE SEEN
UROBILINOGEN UA: 1 mg/dL (ref 0.0–1.0)
pH: 6 (ref 5.0–8.0)

## 2014-02-04 LAB — CYTOLOGY - PAP

## 2014-02-25 ENCOUNTER — Encounter: Payer: Self-pay | Admitting: Gynecology

## 2014-05-15 ENCOUNTER — Other Ambulatory Visit: Payer: Self-pay

## 2014-05-15 DIAGNOSIS — Z1231 Encounter for screening mammogram for malignant neoplasm of breast: Secondary | ICD-10-CM

## 2014-05-16 ENCOUNTER — Ambulatory Visit
Admission: RE | Admit: 2014-05-16 | Discharge: 2014-05-16 | Disposition: A | Discharge status: Home / Self Care | Payer: 59 | Source: Ambulatory Visit

## 2014-05-16 DIAGNOSIS — Z1231 Encounter for screening mammogram for malignant neoplasm of breast: Secondary | ICD-10-CM

## 2014-08-09 ENCOUNTER — Other Ambulatory Visit: Payer: Self-pay | Admitting: Gynecology

## 2014-08-12 ENCOUNTER — Other Ambulatory Visit: Payer: Self-pay | Admitting: Gynecology

## 2015-01-13 ENCOUNTER — Ambulatory Visit (INDEPENDENT_AMBULATORY_CARE_PROVIDER_SITE_OTHER): Payer: 59 | Admitting: Physician Assistant

## 2015-01-13 VITALS — BP 130/80 | HR 84 | Temp 97.9°F | Resp 18 | Ht 65.0 in | Wt 184.4 lb

## 2015-01-13 DIAGNOSIS — N939 Abnormal uterine and vaginal bleeding, unspecified: Secondary | ICD-10-CM | POA: Diagnosis not present

## 2015-01-13 DIAGNOSIS — D649 Anemia, unspecified: Secondary | ICD-10-CM

## 2015-01-13 DIAGNOSIS — Z114 Encounter for screening for human immunodeficiency virus [HIV]: Secondary | ICD-10-CM

## 2015-01-13 DIAGNOSIS — Z6830 Body mass index (BMI) 30.0-30.9, adult: Secondary | ICD-10-CM | POA: Insufficient documentation

## 2015-01-13 DIAGNOSIS — Z1159 Encounter for screening for other viral diseases: Secondary | ICD-10-CM

## 2015-01-13 LAB — POCT CBC
GRANULOCYTE PERCENT: 47.3 % (ref 37–80)
HCT, POC: 31.2 % — AB (ref 37.7–47.9)
Hemoglobin: 9.5 g/dL — AB (ref 12.2–16.2)
LYMPH, POC: 3.1 (ref 0.6–3.4)
MCH, POC: 25.3 pg — AB (ref 27–31.2)
MCHC: 30.4 g/dL — AB (ref 31.8–35.4)
MCV: 83.2 fL (ref 80–97)
MID (cbc): 0.3 (ref 0–0.9)
MPV: 7.5 fL (ref 0–99.8)
POC Granulocyte: 3.1 (ref 2–6.9)
POC LYMPH PERCENT: 48.3 %L (ref 10–50)
POC MID %: 4.4 %M (ref 0–12)
Platelet Count, POC: 272 10*3/uL (ref 142–424)
RBC: 3.75 M/uL — AB (ref 4.04–5.48)
RDW, POC: 15.7 %
WBC: 6.5 10*3/uL (ref 4.6–10.2)

## 2015-01-13 LAB — POCT URINE PREGNANCY: Preg Test, Ur: NEGATIVE

## 2015-01-13 NOTE — Patient Instructions (Addendum)
Contact Dr. Zelphia Cairo office tomorrow to schedule an appointment. They can perform the pelvic ultrasound there in the office. Dr. Phineas Real can also see our notes and the results of the labs drawn this evening.  Please reconsider getting a flu vaccine this season (recommended by the end of October).

## 2015-01-13 NOTE — Progress Notes (Signed)
Subjective:     Patient ID: Katelyn Salinas, female   DOB: 1960-11-10, 54 y.o.   MRN: [id number] PCP: Dwan Bolt, MD  Chief Complaint  Patient presents with  . Menorrhagia    Has gone through 2 1/2 boxes of tampons & noticing clots x 10 days now. Last period prior to this event was a few months ago    HPI Patient is a 54 yo F who presents today for evaluation of heavy menstrual bleeding. LMP began on 9/09 and is currently still menstruating. Her periods are normally heavy and last 5-6 days. It began as a normal period, and after 5-6 days it started to taper off then she began heavily bleeding again. She has noticed blood clots, and has had to go through 2 1/2 boxes of super plus tampons and having to use pads. She will bleed through her clothes within 2-3 hours. With her previous periods, she usually uses super tampons and has not needed to use a pad as well. Before this period, her LMP was a few months ago and was normal. She is going through pre-menopause and is taking Estrogen as well as Progesterone which is monitored by her OB/GYN. She has a history of fibroids. Her last pelvic ultrasound was last year, and showed that the fibroids had not increased in size.  She is currently feeling fatigued. No syncope, light-headedness, or dizziness.     Review of Systems  Constitutional: Positive for fatigue.  Genitourinary: Positive for vaginal bleeding and menstrual problem. Negative for vaginal pain.  Musculoskeletal: Positive for back pain (Yesterday, has since resolved).  Neurological: Negative for dizziness, syncope and headaches.  See HPI   Patient Active Problem List   Diagnosis Date Noted  . Axillary mass 04/05/2013  . Ovarian cyst, right 04/04/2013  . History of cervical dysplasia 01/16/2013  . Fibroid uterus 01/16/2013  . Postmenopausal HRT (hormone replacement therapy) 01/16/2013     Prior to Admission medications   Medication Sig Start Date End Date Taking?  Authorizing Provider  estradiol (ESTRACE) 1 MG tablet Take 1 1/2 tablets daily by mouth 02/01/14  Yes Timothy P Fontaine, MD  fish oil-omega-3 fatty acids 1000 MG capsule Take 1 g by mouth daily.   Yes Historical Provider, MD  fluticasone (FLONASE) 50 MCG/ACT nasal spray Place 2 sprays into the nose daily. 01/10/13  Yes Roselee Culver, MD  medroxyPROGESTERone (PROVERA) 10 MG tablet Take 1 tablet (10 mg total) by mouth daily. For the first 12 days each month 02/01/14  Yes Anastasio Auerbach, MD  Multiple Vitamin (MULTIVITAMIN PO) Take by mouth.     Yes Historical Provider, MD  naproxen sodium (ANAPROX) 550 MG tablet TAKE 1 TABLET THREE TIMES A DAY 12/27/13  Yes Huel Cote, NP  terconazole (TERAZOL 3) 0.8 % vaginal cream One applicatorful at bedtime in vagina for 3 days 01/16/13  Yes Terrance Mass, MD  Valsartan (DIOVAN PO) Take by mouth.     Yes Historical Provider, MD  albuterol (PROVENTIL HFA;VENTOLIN HFA) 108 (90 BASE) MCG/ACT inhaler Inhale 2 puffs into the lungs every 4 (four) hours as needed for wheezing (cough, shortness of breath or wheezing.). Patient not taking: Reported on 01/13/2015 01/10/13   Roselee Culver, MD  citalopram (CELEXA) 20 MG tablet Take 20 mg by mouth daily.    Historical Provider, MD    No Known Allergies    Objective:  Physical Exam  Constitutional: She is oriented to person, place, and time. She appears well-developed  and well-nourished.  HENT:  Head: Normocephalic and atraumatic.  Neck: Normal range of motion. Neck supple.  Cardiovascular: Normal rate and regular rhythm.   Pulmonary/Chest: Effort normal and breath sounds normal.  Abdominal: Soft. Bowel sounds are normal. She exhibits no distension and no mass. There is tenderness (Mild suprapubic tenderness). There is no rebound and no guarding.  Genitourinary: Vagina normal and uterus normal. Cervix exhibits no motion tenderness. Right adnexum displays no mass, no tenderness and no fullness. Left adnexum  displays no mass, no tenderness and no fullness.  Normal external female genitalia. No rashes or lesions. No signs of infection. No discharge appreciated. No cervical motion or adnexal tenderness. No uterine tenderness. Cervical os is closed. Blood noted in vaginal vault. No clots or membranous tissue noted.   Neurological: She is alert and oriented to person, place, and time.  Skin: Skin is warm and dry.  Psychiatric: She has a normal mood and affect. Her behavior is normal. Thought content normal.    BP 130/80 mmHg  Pulse 84  Temp(Src) 97.9 F (36.6 C) (Oral)  Resp 18  Ht 5\' 5"  (1.651 m)  Wt 184 lb 6 oz (83.632 kg)  BMI 30.68 kg/m2  SpO2 99%  LMP 01/03/2015 (Approximate)   Results for orders placed or performed in visit on 01/13/15  POCT CBC  Result Value Ref Range   WBC 6.5 4.6 - 10.2 K/uL   Lymph, poc 3.1 0.6 - 3.4   POC LYMPH PERCENT 48.3 10 - 50 %L   MID (cbc) 0.3 0 - 0.9   POC MID % 4.4 0 - 12 %M   POC Granulocyte 3.1 2 - 6.9   Granulocyte percent 47.3 37 - 80 %G   RBC 3.75 (A) 4.04 - 5.48 M/uL   Hemoglobin 9.5 (A) 12.2 - 16.2 g/dL   HCT, POC 31.2 (A) 37.7 - 47.9 %   MCV 83.2 80 - 97 fL   MCH, POC 25.3 (A) 27 - 31.2 pg   MCHC 30.4 (A) 31.8 - 35.4 g/dL   RDW, POC 15.7 %   Platelet Count, POC 272 142 - 424 K/uL   MPV 7.5 0 - 99.8 fL  POCT urine pregnancy  Result Value Ref Range   Preg Test, Ur Negative Negative     Assessment & Plan:  1. Episode of heavy vaginal bleeding Unclear etiology. Suspect that uterine fibroids are the cause. Encouraged patient to call OB/GYN tomorrow morning for further evaluation.  - POCT CBC - TSH - POCT urine pregnancy - Chlamydia trachomatis, RNA  2. Anemia, unspecified anemia type Suspect heavy vaginal bleeding as the etiology. Counseled that she does not need a blood transfusion at this time, but if she begins to feel her symptoms are worsening, RTC or go to the Emergency Room.   3. Screening for HIV (human immunodeficiency  virus) - HIV antibody  4. Need for hepatitis C screening test - Hepatitis C antibody  Declines flu vaccine today.   Amber D. Race, PA-S Physician Assistant Student [facility]

## 2015-01-13 NOTE — Progress Notes (Signed)
Patient ID: Katelyn Salinas, female    DOB: 05-29-1960, 54 y.o.   MRN: [id number]  PCP: Dwan Bolt, MD  Subjective:   Chief Complaint  Patient presents with  . Menorrhagia    Has gone through 2 1/2 boxes of tampons & noticing clots x 10 days now. Last period prior to this event was a few months ago    HPI Presents for evaluation of heavy menstrual bleeding.   LMP began on 9/09 and is currently still menstruating. It began as a normal period, and after 5-6 days it started to taper off (which is how long her periods normally last) and then began heavily bleeding. She has noticed blood clots, and is having to go through 2 1/2 boxes of super plus tampons since 9/09 and having to use pads as well. She will bleed through her clothes by 2-3 hours. She usually uses super tampons and do not need pads.   Before this period, her LMP was a months ago and was normal. She is going through pre-menopause and is taking Estrogen and progesterone. She has a hx of fibroids, the last visit she had last year the [place] didn't show that the fibroids have grown at all. She is feeling fatigued. No syncope or dizziness. No SOB.  Sexually active with one female partner, her husband. S/p tubal ligation.  Review of Systems  Constitutional: Positive for diaphoresis (hot flashes that she relates to peri-menopausal) and fatigue. Negative for fever and chills.  Genitourinary: Positive for vaginal bleeding and menstrual problem.  Musculoskeletal: Negative for myalgias and arthralgias.  Skin: Negative for rash.  Allergic/Immunologic: Negative for immunocompromised state.  Neurological: Negative for dizziness and weakness.  Hematological: Negative for adenopathy. Does not bruise/bleed easily.    Patient Active Problem List   Diagnosis Date Noted  . Axillary mass 04/05/2013  . Ovarian cyst, right 04/04/2013  . History of cervical dysplasia 01/16/2013  . Fibroid uterus 01/16/2013  . Postmenopausal HRT  (hormone replacement therapy) 01/16/2013     Prior to Admission medications   Medication Sig Start Date End Date Taking? Authorizing Provider  estradiol (ESTRACE) 1 MG tablet Take 1 1/2 tablets daily by mouth 02/01/14  Yes Timothy P Fontaine, MD  fish oil-omega-3 fatty acids 1000 MG capsule Take 1 g by mouth daily.   Yes Historical Provider, MD  fluticasone (FLONASE) 50 MCG/ACT nasal spray Place 2 sprays into the nose daily. 01/10/13  Yes Roselee Culver, MD  medroxyPROGESTERone (PROVERA) 10 MG tablet Take 1 tablet (10 mg total) by mouth daily. For the first 12 days each month 02/01/14  Yes Anastasio Auerbach, MD  Multiple Vitamin (MULTIVITAMIN PO) Take by mouth.     Yes Historical Provider, MD  naproxen sodium (ANAPROX) 550 MG tablet TAKE 1 TABLET THREE TIMES A DAY 12/27/13  Yes Huel Cote, NP  terconazole (TERAZOL 3) 0.8 % vaginal cream One applicatorful at bedtime in vagina for 3 days 01/16/13  Yes Terrance Mass, MD  Valsartan (DIOVAN PO) Take by mouth.     Yes Historical Provider, MD  albuterol (PROVENTIL HFA;VENTOLIN HFA) 108 (90 BASE) MCG/ACT inhaler Inhale 2 puffs into the lungs every 4 (four) hours as needed for wheezing (cough, shortness of breath or wheezing.). Patient not taking: Reported on 01/13/2015 01/10/13   Roselee Culver, MD  citalopram (CELEXA) 20 MG tablet Take 20 mg by mouth daily.    Historical Provider, MD     No Known Allergies     Objective:  Physical Exam  Constitutional: She is oriented to person, place, and time. She appears well-developed and well-nourished. No distress.  BP 130/80 mmHg  Pulse 84  Temp(Src) 97.9 F (36.6 C) (Oral)  Resp 18  Ht 5\' 5"  (1.651 m)  Wt 184 lb 6 oz (83.632 kg)  BMI 30.68 kg/m2  SpO2 99%  LMP 01/03/2015 (Approximate)   Eyes: Conjunctivae are normal. No scleral icterus.  Neck: No thyromegaly present.  Cardiovascular: Normal rate, regular rhythm, normal heart sounds and intact distal pulses.   Pulmonary/Chest: Effort  normal and breath sounds normal.  Abdominal: Hernia confirmed negative in the right inguinal area and confirmed negative in the left inguinal area.  Genitourinary: Vagina normal. Pelvic exam was performed with patient supine. No labial fusion. There is no rash, tenderness, lesion or injury on the right labia. There is no rash, tenderness, lesion or injury on the left labia. Uterus is not deviated, not enlarged, not fixed and not tender. Cervix exhibits no motion tenderness, no discharge and no friability. Right adnexum displays no mass, no tenderness and no fullness. Left adnexum displays no mass, no tenderness and no fullness. Bleeding: large amount of blood in the vaginal vault.  Lymphadenopathy:    She has no cervical adenopathy.       Right: No inguinal adenopathy present.       Left: No inguinal adenopathy present.  Neurological: She is alert and oriented to person, place, and time.  Skin: Skin is warm and dry.  Psychiatric: She has a normal mood and affect. Her behavior is normal.       Results for orders placed or performed in visit on 01/13/15  POCT CBC  Result Value Ref Range   WBC 6.5 4.6 - 10.2 K/uL   Lymph, poc 3.1 0.6 - 3.4   POC LYMPH PERCENT 48.3 10 - 50 %L   MID (cbc) 0.3 0 - 0.9   POC MID % 4.4 0 - 12 %M   POC Granulocyte 3.1 2 - 6.9   Granulocyte percent 47.3 37 - 80 %G   RBC 3.75 (A) 4.04 - 5.48 M/uL   Hemoglobin 9.5 (A) 12.2 - 16.2 g/dL   HCT, POC 31.2 (A) 37.7 - 47.9 %   MCV 83.2 80 - 97 fL   MCH, POC 25.3 (A) 27 - 31.2 pg   MCHC 30.4 (A) 31.8 - 35.4 g/dL   RDW, POC 15.7 %   Platelet Count, POC 272 142 - 424 K/uL   MPV 7.5 0 - 99.8 fL  POCT urine pregnancy  Result Value Ref Range   Preg Test, Ur Negative Negative       Assessment & Plan:   1. Episode of heavy vaginal bleeding Most likely related to peri-menopause and uterine fibroids. Await remaining results. She will call tomorrow to schedule with her GYN, as I believe that she needs a pelvic US which  can be performed in the office. - POCT CBC - TSH - POCT urine pregnancy - Chlamydia trachomatis, RNA  2. Anemia, unspecified anemia type Due to #1. If she continues to bleed heavily, will need to monitor.  3. Screening for HIV (human immunodeficiency virus) - HIV antibody  4. Need for hepatitis C screening test - Hepatitis C antibody  Declined flu vaccine today.  Fara Chute, PA-C Physician Assistant-Certified [facility]

## 2015-01-14 LAB — TSH: TSH: 1.762 u[IU]/mL (ref 0.350–4.500)

## 2015-01-14 LAB — HIV ANTIBODY (ROUTINE TESTING W REFLEX): HIV: NONREACTIVE

## 2015-01-14 LAB — HEPATITIS C ANTIBODY: HCV AB: NEGATIVE

## 2015-01-15 ENCOUNTER — Encounter: Payer: Self-pay | Admitting: Physician Assistant

## 2015-01-15 LAB — CHLAMYDIA TRACHOMATIS, PROBE AMP: CT Probe RNA: NEGATIVE

## 2015-01-17 ENCOUNTER — Encounter: Payer: Self-pay | Admitting: Gynecology

## 2015-01-17 ENCOUNTER — Ambulatory Visit (INDEPENDENT_AMBULATORY_CARE_PROVIDER_SITE_OTHER): Payer: Managed Care, Other (non HMO) | Admitting: Gynecology

## 2015-01-17 VITALS — BP 118/76

## 2015-01-17 DIAGNOSIS — D259 Leiomyoma of uterus, unspecified: Secondary | ICD-10-CM

## 2015-01-17 DIAGNOSIS — N920 Excessive and frequent menstruation with regular cycle: Secondary | ICD-10-CM

## 2015-01-17 NOTE — Patient Instructions (Signed)
Follow up for ultrasound as scheduled 

## 2015-01-17 NOTE — Progress Notes (Signed)
Katelyn Salinas [birth year] [id number]        54 y.o.  G1P1001 Presents complaining of recent 2 weeks of heavy bleeding. Now resolving.  Patient is on HRT consist of estradiol 1 mg daily him progesterone 10 mg for the first 12 days of each month. She has regular withdrawal bleeds but skipped last month and then started this month bleeding and then continued for 2 weeks following the progesterone withdrawal. Recently seen at [facility] with hemoglobin of 9. Does have history of leiomyoma with last ultrasound 2014 showing multiple smaller myomas with uterus approximately 10 weeks size.  Past medical history,surgical history, problem list, medications, allergies, family history and social history were all reviewed and documented in the EPIC chart.  Directed ROS with pertinent positives and negatives documented in the history of present illness/assessment and plan.  Exam: [place] assistant Filed Vitals:   01/17/15 0943  BP: 118/76   General appearance:  Normal Abdomen soft nontender without masses guarding rebound Pelvic external BUS vagina with scant bleeding. Cervix normal. Uterus bulky approximately 14+ weeks but difficult to outline. Nontender. Adnexa without gross masses or tenderness.  Assessment/Plan:  54 y.o. G1P1001 with recent episode of heavy prolonged bleeding.  Known leiomyoma. Exam feels like leiomyoma have enlarged. We'll start with sonohysterogram for uterine assessment and endometrial sampling. She'll continue on her HRT for now. Possibilities to include hysterectomy discussed. Need to start on iron daily reviewed. Follow up for ultrasound and then we will go from there.    Anastasio Auerbach MD, 10:00 AM 01/17/2015

## 2015-02-13 ENCOUNTER — Other Ambulatory Visit: Payer: Self-pay | Admitting: Gynecology

## 2015-02-19 ENCOUNTER — Other Ambulatory Visit: Payer: Self-pay | Admitting: Gynecology

## 2015-02-19 ENCOUNTER — Telehealth: Payer: Self-pay | Admitting: Gynecology

## 2015-02-19 DIAGNOSIS — D251 Intramural leiomyoma of uterus: Secondary | ICD-10-CM

## 2015-02-19 NOTE — Telephone Encounter (Signed)
02/19/15-I LM VM cell for pt that her total responsibility for the sonohysterogram would be $130.69. That is all that is left before she meets her out of pocket of $4,000. Per Levada Dy at [facility] everything after that would be covered at 100%. No ref # given.wl

## 2015-02-28 ENCOUNTER — Other Ambulatory Visit: Payer: Self-pay | Admitting: Gynecology

## 2015-02-28 ENCOUNTER — Ambulatory Visit (INDEPENDENT_AMBULATORY_CARE_PROVIDER_SITE_OTHER): Payer: Managed Care, Other (non HMO)

## 2015-02-28 ENCOUNTER — Ambulatory Visit (INDEPENDENT_AMBULATORY_CARE_PROVIDER_SITE_OTHER): Payer: 59 | Admitting: Gynecology

## 2015-02-28 ENCOUNTER — Encounter: Payer: Self-pay | Admitting: Gynecology

## 2015-02-28 VITALS — BP 124/80

## 2015-02-28 DIAGNOSIS — N939 Abnormal uterine and vaginal bleeding, unspecified: Secondary | ICD-10-CM

## 2015-02-28 DIAGNOSIS — N766 Ulceration of vulva: Secondary | ICD-10-CM

## 2015-02-28 DIAGNOSIS — N926 Irregular menstruation, unspecified: Secondary | ICD-10-CM

## 2015-02-28 DIAGNOSIS — D251 Intramural leiomyoma of uterus: Secondary | ICD-10-CM

## 2015-02-28 DIAGNOSIS — D259 Leiomyoma of uterus, unspecified: Secondary | ICD-10-CM | POA: Diagnosis not present

## 2015-02-28 MED ORDER — MEDROXYPROGESTERONE ACETATE 10 MG PO TABS: 10.0000 mg | ORAL_TABLET | Freq: Every day | ORAL | Status: DC

## 2015-02-28 NOTE — Addendum Note (Signed)
Addended by: Nelva Nay on: 02/28/2015 09:40 AM   Modules accepted: Orders

## 2015-02-28 NOTE — Patient Instructions (Signed)
Office will call you with the biopsy results  Office will call you with the viral culture results  Follow up for annual exam appointment as scheduled

## 2015-02-28 NOTE — Progress Notes (Signed)
Katelyn Salinas [facility] 07/13/1960 [id number]        54 y.o.  G1P1001 presents for sonohysterogram. History of recent irregular bleeding on HRT. Patient also has a history of leiomyoma with enlarged uterus on physical exam. Patient now noting sore left lower labia majora comes and goes several times yearly last for approximately one week.  No history of herpes either personally or her husband accompanies her today.  Past medical history,surgical history, problem list, medications, allergies, family history and social history were all reviewed and documented in the EPIC chart.  Directed ROS with pertinent positives and negatives documented in the history of present illness/assessment and plan.  Exam: [name] assistant Filed Vitals:   02/28/15 0915  BP: 124/80   General appearance:  Normal External BUS vagina with small ulcerative lesion left lower labia majora. Viral culture taken. Cervix grossly normal.  Ultrasound shows uterus enlarged with multiple myomas measuring 47mm, 31 mm 2, 29 mm, 28 mm, 23 mm. Endometrial echo 6.5 mm. Right and left ovaries grossly normal. Cul-de-sac negative.  Sonohysterogram performed, sterile technique, easy catheter introduction, good distention with no abnormalities. Endometrial biopsy taken. Patient tolerated well.  Assessment/Plan:  54 y.o. G1P1001 with:  1. Irregular bleeding on HRT over the past month. Using intermittent progesterone withdrawal monthly with usual withdrawal bleeding. Ultrasound confirms multiple small myomas which overall compared to ultrasound 2014 unchanged. Uterine length 107 mm 2014 and 112 mm now.  Ultrasound shows no intracavitary abnormalities. Patient will follow up for biopsy results and assuming negative then we'll plan expectant management with continuation of her HRT regimen for now. She is overdue for her annual exam she's can schedule that and will further discuss HRT at that time. 2. Ulcerative lesion left lower labia majora.  Suspicious for HSV. Discussed suspicion with the patient and her husband. We'll await culture results. If negative patient understands no guarantee it is not herpes and I asked her to represent with any further outbreaks to reculture at that time. If positive options for continuous daily Valtrex suppression versus intermittent treatment for outbreaks. The pros/cons of each approach were reviewed. Patients will think about this and follow up for culture results.    Anastasio Auerbach MD, 9:30 AM 02/28/2015

## 2015-03-03 LAB — HERPES SIMPLEX VIRUS CULTURE: ORGANISM ID, BACTERIA: NOT DETECTED

## 2015-03-10 ENCOUNTER — Other Ambulatory Visit: Payer: Self-pay | Admitting: Gynecology

## 2015-05-09 ENCOUNTER — Encounter: Payer: 59 | Admitting: Gynecology

## 2015-05-30 ENCOUNTER — Ambulatory Visit (INDEPENDENT_AMBULATORY_CARE_PROVIDER_SITE_OTHER): Payer: Managed Care, Other (non HMO) | Admitting: Physician Assistant

## 2015-05-30 VITALS — BP 140/88 | HR 69 | Temp 98.3°F | Resp 16 | Ht 66.0 in | Wt 188.4 lb

## 2015-05-30 DIAGNOSIS — L819 Disorder of pigmentation, unspecified: Secondary | ICD-10-CM

## 2015-05-30 LAB — COMPLETE METABOLIC PANEL WITH GFR
ALBUMIN: 3.8 g/dL (ref 3.6–5.1)
ALK PHOS: 55 U/L (ref 33–130)
ALT: 13 U/L (ref 6–29)
AST: 17 U/L (ref 10–35)
BUN: 14 mg/dL (ref 7–25)
CHLORIDE: 98 mmol/L (ref 98–110)
CO2: 30 mmol/L (ref 20–31)
Calcium: 9.4 mg/dL (ref 8.6–10.4)
Creat: 0.75 mg/dL (ref 0.50–1.05)
GFR, Est African American: >89 mL/min (ref >=60)
Glucose, Bld: 85 mg/dL (ref 65–99)
POTASSIUM: 4.5 mmol/L (ref 3.5–5.3)
SODIUM: 138 mmol/L (ref 135–146)
Total Bilirubin: 0.3 mg/dL (ref 0.2–1.2)
Total Protein: 7.8 g/dL (ref 6.1–8.1)

## 2015-05-30 LAB — C-REACTIVE PROTEIN: CRP: 0.6 mg/dL — AB (ref <0.60)

## 2015-05-30 LAB — POCT SEDIMENTATION RATE: POCT SED RATE: 47 mm/h — AB (ref 0–22)

## 2015-05-30 LAB — POCT GLYCOSYLATED HEMOGLOBIN (HGB A1C): Hemoglobin A1C: 6.1

## 2015-05-30 MED ORDER — CLOTRIMAZOLE-BETAMETHASONE 1-0.05 % EX CREA: 1.0000 "application " | TOPICAL_CREAM | Freq: Two times a day (BID) | CUTANEOUS | Status: DC

## 2015-05-30 NOTE — Patient Instructions (Signed)
Please apply sunscreen to your face daily. Use a very small amount of Lotrisone on the spot on your face and mix with vaseline.

## 2015-05-30 NOTE — Progress Notes (Signed)
05/31/2015 8:39 AM   DOB: [birth year] / MRN: [id number]  SUBJECTIVE:  Katelyn Salinas is a 55 y.o. female presenting for rash on her face and right arm that started 6 months ago.  Both rashes her hyperpigmenting, non itchy, and non tender.  She has tried some special soaps without relief.  She feels like the rash spreading.    She has No Known Allergies.   She  has a past medical history of Fibroid; Adenomyosis; Hypertension; PID (acute pelvic inflammatory disease) (1994); Blood transfusion without reported diagnosis; and Asthma.    She  reports that she has never smoked. She does not have any smokeless tobacco history on file. She reports that she does not drink alcohol or use illicit drugs. She  reports that she currently engages in sexual activity. She reports using the following methods of birth control/protection: Surgical and Post-menopausal. The patient  has past surgical history that includes Laser ablation of the cervix (1989); Breast surgery; and Tubal ligation (1989).  Her family history includes Heart disease in her brother and father; Hypertension in her brother, father, and mother.  Review of Systems  Constitutional: Negative for fever and chills.  Eyes: Negative for blurred vision.  Respiratory: Negative for cough and shortness of breath.   Cardiovascular: Negative for chest pain.  Gastrointestinal: Negative for nausea and abdominal pain.  Genitourinary: Negative for dysuria, urgency and frequency.  Musculoskeletal: Negative for myalgias.  Skin: Negative for rash.  Neurological: Negative for dizziness, tingling and headaches.  Psychiatric/Behavioral: Negative for depression. The patient is not nervous/anxious.     Problem list and medications reviewed and updated by myself where necessary, and exist elsewhere in the encounter.   OBJECTIVE:  BP 140/88 mmHg  Pulse 69  Temp(Src) 98.3 F (36.8 C)  Resp 16  Ht '5\' 6"'$  (1.676 m)  Wt 188 lb 6.4 oz (85.458 kg)  BMI 30.42  kg/m2  SpO2 97%  LMP 03/29/2015  Physical Exam  Constitutional: She is oriented to person, place, and time. She appears well-nourished. No distress.  Eyes: EOM are normal. Pupils are equal, round, and reactive to light.  Cardiovascular: Normal rate.   Pulmonary/Chest: Effort normal.  Abdominal: She exhibits no distension.  Neurological: She is alert and oriented to person, place, and time. No cranial nerve deficit. Gait normal.  Skin: Skin is dry. She is not diaphoretic.     Psychiatric: She has a normal mood and affect.  Vitals reviewed.   Results for orders placed or performed in visit on 05/30/15 (from the past 72 hour(s))  COMPLETE METABOLIC PANEL WITH GFR     Status: None   Collection Time: 05/30/15  1:53 PM  Result Value Ref Range   Sodium 138 135 - 146 mmol/L   Potassium 4.5 3.5 - 5.3 mmol/L   Chloride 98 98 - 110 mmol/L   CO2 30 20 - 31 mmol/L   Glucose, Bld 85 65 - 99 mg/dL   BUN 14 7 - 25 mg/dL   Creat 0.75 0.50 - 1.05 mg/dL   Total Bilirubin 0.3 0.2 - 1.2 mg/dL   Alkaline Phosphatase 55 33 - 130 U/L   AST 17 10 - 35 U/L   ALT 13 6 - 29 U/L   Total Protein 7.8 6.1 - 8.1 g/dL   Albumin 3.8 3.6 - 5.1 g/dL   Calcium 9.4 8.6 - 10.4 mg/dL   GFR, Est African American >89 >=60 mL/min   GFR, Est Non African American >89 >=60 mL/min  Comment:   The estimated GFR is a calculation valid for adults (>=23 years old) that uses the CKD-EPI algorithm to adjust for age and sex. It is   not to be used for children, pregnant women, hospitalized patients,    patients on dialysis, or with rapidly changing kidney function. According to the NKDEP, eGFR >89 is normal, 60-89 shows mild impairment, 30-59 shows moderate impairment, 15-29 shows severe impairment and <15 is ESRD.     HIV antibody     Status: None   Collection Time: 05/30/15  1:53 PM  Result Value Ref Range   HIV 1&2 Ab, 4th Generation NONREACTIVE NONREACTIVE    Comment:   HIV-1 antigen and HIV-1/HIV-2 antibodies  were not detected.  There is no laboratory evidence of HIV infection.   HIV-1/2 Antibody Diff        Not indicated. HIV-1 RNA, Qual TMA          Not indicated.     PLEASE NOTE: This information has been disclosed to you from records whose confidentiality may be protected by state law. If your state requires such protection, then the state law prohibits you from making any further disclosure of the information without the specific written consent of the person to whom it pertains, or as otherwise permitted by law. A general authorization for the release of medical or other information is NOT sufficient for this purpose.   The performance of this assay has not been clinically validated in patients less than 73 years old.   For additional information please refer to http://education.quest[employer].com/faq/FAQ106.  (This link is being provided for informational/educational purposes only.)     C-reactive protein     Status: Abnormal   Collection Time: 05/30/15  1:53 PM  Result Value Ref Range   CRP 0.6 (H) <0.60 mg/dL  POCT glycosylated hemoglobin (Hb A1C)     Status: None   Collection Time: 05/30/15  2:45 PM  Result Value Ref Range   Hemoglobin A1C 6.1   POCT SEDIMENTATION RATE     Status: Abnormal   Collection Time: 05/30/15  3:46 PM  Result Value Ref Range   POCT SED RATE 47 (A) 0 - 22 mm/hr    No results found.  ASSESSMENT AND PLAN  Katelyn Salinas was seen today for rash.  Diagnoses and all orders for this visit:  Hyperpigmentation: This is most likely melasma.  We check for underlying pathology.  Will cover for fungus and eczema variant with cream.  -     ACTH -     COMPLETE METABOLIC PANEL WITH GFR -     POCT glycosylated hemoglobin (Hb A1C) -     HIV antibody -     POCT SEDIMENTATION RATE -     C-reactive protien -     clotrimazole-betamethasone (LOTRISONE) cream; Apply 1 application topically 2 (two) times daily.    The patient was advised to call or return to  clinic if she does not see an improvement in symptoms or to seek the care of the closest emergency department if she worsens with the above plan.   Philis Fendt, MHS, PA-C [facility] 05/31/2015 8:39 AM

## 2015-05-31 LAB — HIV ANTIBODY (ROUTINE TESTING W REFLEX): HIV 1&2 Ab, 4th Generation: NONREACTIVE

## 2015-06-02 LAB — ACTH: C206 ACTH: 10 pg/mL (ref 6–50)

## 2015-06-17 ENCOUNTER — Other Ambulatory Visit: Payer: Self-pay | Admitting: Gynecology

## 2015-06-25 ENCOUNTER — Ambulatory Visit (INDEPENDENT_AMBULATORY_CARE_PROVIDER_SITE_OTHER): Payer: Managed Care, Other (non HMO) | Admitting: Gynecology

## 2015-06-25 ENCOUNTER — Encounter: Payer: Self-pay | Admitting: Gynecology

## 2015-06-25 VITALS — BP 124/78 | Ht 65.0 in | Wt 186.0 lb

## 2015-06-25 DIAGNOSIS — Z01419 Encounter for gynecological examination (general) (routine) without abnormal findings: Secondary | ICD-10-CM

## 2015-06-25 DIAGNOSIS — D251 Intramural leiomyoma of uterus: Secondary | ICD-10-CM

## 2015-06-25 DIAGNOSIS — Z79890 Hormone replacement therapy (postmenopausal): Secondary | ICD-10-CM

## 2015-06-25 MED ORDER — ESTRADIOL 1 MG PO TABS: 1.0000 mg | ORAL_TABLET | Freq: Every day | ORAL | Status: DC

## 2015-06-25 MED ORDER — PROGESTERONE MICRONIZED 100 MG PO CAPS: 100.0000 mg | ORAL_CAPSULE | Freq: Every day | ORAL | Status: DC

## 2015-06-25 NOTE — Patient Instructions (Signed)
Schedule your colonoscopy with either:  [facility]   Address: [address], [place], [place] [address]  [phone]    or  [facility]  Address: [address], [place], [place] [address]  [phone]      You may obtain a copy of any labs that were done today by logging onto MyChart as outlined in the instructions provided with your AVS (after visit summary). The office will not call with normal lab results but certainly if there are any significant abnormalities then we will contact you.   Health Maintenance Adopting a healthy lifestyle and getting preventive care can go a long way to promote health and wellness. Talk with your health care provider about what schedule of regular examinations is right for you. This is a good chance for you to check in with your provider about disease prevention and staying healthy. In between checkups, there are plenty of things you can do on your own. Experts have done a lot of research about which lifestyle changes and preventive measures are most likely to keep you healthy. Ask your health care provider for more information. WEIGHT AND DIET  Eat a healthy diet  Be sure to include plenty of vegetables, fruits, low-fat dairy products, and lean protein.  Do not eat a lot of foods high in solid fats, added sugars, or salt.  Get regular exercise. This is one of the most important things you can do for your health.  Most adults should exercise for at least 150 minutes each week. The exercise should increase your heart rate and make you sweat (moderate-intensity exercise).  Most adults should also do strengthening exercises at least twice a week. This is in addition to the moderate-intensity exercise.  Maintain a healthy weight  Body mass index (BMI) is a measurement that can be used to identify possible weight problems. It estimates body fat based on height and weight. Your health care provider can help determine  your BMI and help you achieve or maintain a healthy weight.  For females 22 years of age and older:   A BMI below 18.5 is considered underweight.  A BMI of 18.5 to 24.9 is normal.  A BMI of 25 to 29.9 is considered overweight.  A BMI of 30 and above is considered obese.  Watch levels of cholesterol and blood lipids  You should start having your blood tested for lipids and cholesterol at 54 years of age, then have this test every 5 years.  You may need to have your cholesterol levels checked more often if:  Your lipid or cholesterol levels are high.  You are older than 54 years of age.  You are at high risk for heart disease.  CANCER SCREENING   Lung Cancer  Lung cancer screening is recommended for adults 19-51 years old who are at high risk for lung cancer because of a history of smoking.  A yearly low-dose CT scan of the lungs is recommended for people who:  Currently smoke.  Have quit within the past 15 years.  Have at least a 30-pack-year history of smoking. A pack year is smoking an average of one pack of cigarettes a day for 1 year.  Yearly screening should continue until it has been 15 years since you quit.  Yearly screening should stop if you develop a health problem that would prevent you from having lung cancer treatment.  Breast Cancer  Practice breast self-awareness. This means understanding how your breasts normally appear and  feel.  It also means doing regular breast self-exams. Let your health care provider know about any changes, no matter how small.  If you are in your 20s or 30s, you should have a clinical breast exam (CBE) by a health care provider every 1-3 years as part of a regular health exam.  If you are 35 or older, have a CBE every year. Also consider having a breast X-ray (mammogram) every year.  If you have a family history of breast cancer, talk to your health care provider about genetic screening.  If you are at high risk for breast  cancer, talk to your health care provider about having an MRI and a mammogram every year.  Breast cancer gene (BRCA) assessment is recommended for women who have family members with BRCA-related cancers. BRCA-related cancers include:  Breast.  Ovarian.  Tubal.  Peritoneal cancers.  Results of the assessment will determine the need for genetic counseling and BRCA1 and BRCA2 testing. Cervical Cancer Routine pelvic examinations to screen for cervical cancer are no longer recommended for nonpregnant women who are considered low risk for cancer of the pelvic organs (ovaries, uterus, and vagina) and who do not have symptoms. A pelvic examination may be necessary if you have symptoms including those associated with pelvic infections. Ask your health care provider if a screening pelvic exam is right for you.   The Pap test is the screening test for cervical cancer for women who are considered at risk.  If you had a hysterectomy for a problem that was not cancer or a condition that could lead to cancer, then you no longer need Pap tests.  If you are older than 65 years, and you have had normal Pap tests for the past 10 years, you no longer need to have Pap tests.  If you have had past treatment for cervical cancer or a condition that could lead to cancer, you need Pap tests and screening for cancer for at least 20 years after your treatment.  If you no longer get a Pap test, assess your risk factors if they change (such as having a new sexual partner). This can affect whether you should start being screened again.  Some women have medical problems that increase their chance of getting cervical cancer. If this is the case for you, your health care provider may recommend more frequent screening and Pap tests.  The human papillomavirus (HPV) test is another test that may be used for cervical cancer screening. The HPV test looks for the virus that can cause cell changes in the cervix. The cells  collected during the Pap test can be tested for HPV.  The HPV test can be used to screen women 72 years of age and older. Getting tested for HPV can extend the interval between normal Pap tests from three to five years.  An HPV test also should be used to screen women of any age who have unclear Pap test results.  After 54 years of age, women should have HPV testing as often as Pap tests.  Colorectal Cancer  This type of cancer can be detected and often prevented.  Routine colorectal cancer screening usually begins at 53 years of age and continues through 54 years of age.  Your health care provider may recommend screening at an earlier age if you have risk factors for colon cancer.  Your health care provider may also recommend using home test kits to check for hidden blood in the stool.  A small camera  at the end of a tube can be used to examine your colon directly (sigmoidoscopy or colonoscopy). This is done to check for the earliest forms of colorectal cancer.  Routine screening usually begins at age 40.  Direct examination of the colon should be repeated every 5-10 years through 54 years of age. However, you may need to be screened more often if early forms of precancerous polyps or small growths are found. Skin Cancer  Check your skin from head to toe regularly.  Tell your health care provider about any new moles or changes in moles, especially if there is a change in a mole's shape or color.  Also tell your health care provider if you have a mole that is larger than the size of a pencil eraser.  Always use sunscreen. Apply sunscreen liberally and repeatedly throughout the day.  Protect yourself by wearing long sleeves, pants, a wide-brimmed hat, and sunglasses whenever you are outside. HEART DISEASE, DIABETES, AND HIGH BLOOD PRESSURE   Have your blood pressure checked at least every 1-2 years. High blood pressure causes heart disease and increases the risk of stroke.  If  you are between 74 years and 72 years old, ask your health care provider if you should take aspirin to prevent strokes.  Have regular diabetes screenings. This involves taking a blood sample to check your fasting blood sugar level.  If you are at a normal weight and have a low risk for diabetes, have this test once every three years after 54 years of age.  If you are overweight and have a high risk for diabetes, consider being tested at a younger age or more often. PREVENTING INFECTION  Hepatitis B  If you have a higher risk for hepatitis B, you should be screened for this virus. You are considered at high risk for hepatitis B if:  You were born in a country where hepatitis B is common. Ask your health care provider which countries are considered high risk.  Your parents were born in a high-risk country, and you have not been immunized against hepatitis B (hepatitis B vaccine).  You have HIV or AIDS.  You use needles to inject street drugs.  You live with someone who has hepatitis B.  You have had sex with someone who has hepatitis B.  You get hemodialysis treatment.  You take certain medicines for conditions, including cancer, organ transplantation, and autoimmune conditions. Hepatitis C  Blood testing is recommended for:  Everyone born from [birth year] through 1964.  Anyone with known risk factors for hepatitis C. Sexually transmitted infections (STIs)  You should be screened for sexually transmitted infections (STIs) including gonorrhea and chlamydia if:  You are sexually active and are younger than 54 years of age.  You are older than 54 years of age and your health care provider tells you that you are at risk for this type of infection.  Your sexual activity has changed since you were last screened and you are at an increased risk for chlamydia or gonorrhea. Ask your health care provider if you are at risk.  If you do not have HIV, but are at risk, it may be recommended that  you take a prescription medicine daily to prevent HIV infection. This is called pre-exposure prophylaxis (PrEP). You are considered at risk if:  You are sexually active and do not regularly use condoms or know the HIV status of your partner(s).  You take drugs by injection.  You are sexually active with a partner  who has HIV. Talk with your health care provider about whether you are at high risk of being infected with HIV. If you choose to begin PrEP, you should first be tested for HIV. You should then be tested every 3 months for as long as you are taking PrEP.  PREGNANCY   If you are premenopausal and you may become pregnant, ask your health care provider about preconception counseling.  If you may become pregnant, take 400 to 800 micrograms (mcg) of folic acid every day.  If you want to prevent pregnancy, talk to your health care provider about birth control (contraception). OSTEOPOROSIS AND MENOPAUSE   Osteoporosis is a disease in which the bones lose minerals and strength with aging. This can result in serious bone fractures. Your risk for osteoporosis can be identified using a bone density scan.  If you are 8 years of age or older, or if you are at risk for osteoporosis and fractures, ask your health care provider if you should be screened.  Ask your health care provider whether you should take a calcium or vitamin D supplement to lower your risk for osteoporosis.  Menopause may have certain physical symptoms and risks.  Hormone replacement therapy may reduce some of these symptoms and risks. Talk to your health care provider about whether hormone replacement therapy is right for you.  HOME CARE INSTRUCTIONS   Schedule regular health, dental, and eye exams.  Stay current with your immunizations.   Do not use any tobacco products including cigarettes, chewing tobacco, or electronic cigarettes.  If you are pregnant, do not drink alcohol.  If you are breastfeeding, limit how  much and how often you drink alcohol.  Limit alcohol intake to no more than 1 drink per day for nonpregnant women. One drink equals 12 ounces of beer, 5 ounces of wine, or 1 ounces of hard liquor.  Do not use street drugs.  Do not share needles.  Ask your health care provider for help if you need support or information about quitting drugs.  Tell your health care provider if you often feel depressed.  Tell your health care provider if you have ever been abused or do not feel safe at home. Document Released: 10/26/2010 Document Revised: 08/27/2013 Document Reviewed: 03/14/2013 [facility] Patient Information 2015 [place], [place]. This information is not intended to replace advice given to you by your health care provider. Make sure you discuss any questions you have with your health care provider.

## 2015-06-25 NOTE — Progress Notes (Signed)
Katelyn Salinas 03/30/1961 [id number]        55 y.o.  G1P1001  for annual exam.  Several issues noted below.  Past medical history,surgical history, problem list, medications, allergies, family history and social history were all reviewed and documented as reviewed in the EPIC chart.  ROS:  Performed with pertinent positives and negatives included in the history, assessment and plan.   Additional significant findings :  none   Exam: [name] assistant Filed Vitals:   06/25/15 1454  BP: 124/78  Height: 5\' 5"  (1.651 m)  Weight: 186 lb (84.369 kg)   General appearance:  Normal affect, orientation and appearance. Skin: Grossly normal HEENT: Without gross lesions.  No cervical or supraclavicular adenopathy. Thyroid normal.  Lungs:  Clear without wheezing, rales or rhonchi Cardiac: RR, without RMG Abdominal:  Soft, nontender, without masses, guarding, rebound, organomegaly or hernia Breasts:  Examined lying and sitting without masses, retractions, discharge or axillary adenopathy. Pelvic:  Ext/BUS/vagina normal  Cervix normal  Uterus 54 weeks irregular consistent with leiomyoma   Adnexa without masses or tenderness    Anus and perineum normal   Rectovaginal normal sphincter tone without palpated masses or tenderness.    Assessment/Plan:  55 y.o. G68P1001 female for annual exam, history of tubal ligation.   1. History of irregular bleeding/HRT. Evaluated this past fall with irregular bleeding. Sonohysterogram showed enlarged uterus with multiple myomas and a negative sonohysterogram with negative endometrial biopsy. Has done no bleeding since then. Is on Estrace 1-1/2 mg daily and Provera 10 mg for the first 12 days each month without withdrawal bleed. We have discussed the issues and risks of HRT include the WHI study and risk of stroke heart attack DVT and breast cancer. This point patient wants to continue I did suggest decreasing to Estrace 1 mg daily and switching to Prometrium  100 mg nightly to see if we can remain without menses. Patient knows to call me if she does any bleeding. Refill for both 1 year provided. 2. Leiomyoma. Uterus bulky but patient is asymptomatic. Exam is stable over time. We'll continue to monitor with annual exams. 3. Pap smear/HPV 01/2014 negative. No Pap smear done today. History of laser for CIN-1 in 1989 with normal Pap smears afterwards. 4. Mammography due now and patient agrees to call and schedule.  SBE monthly reviewed. 5. DEXA 09/2009 normal. Plan repeat DEXA at age 57. Increased calcium and vitamin D reviewed. 6. Colonoscopy over 10 years ago. Recommend scheduling now she agrees to call and schedule. Names and numbers given. 7. Health maintenance.  No routine blood work done as patient reports it done at her primary physician's office. Follow up 1 year, sooner as needed.  Anastasio Auerbach MD, 3:21 PM 06/25/2015

## 2015-06-30 ENCOUNTER — Other Ambulatory Visit: Payer: Self-pay | Admitting: Endocrinology

## 2015-06-30 DIAGNOSIS — E041 Nontoxic single thyroid nodule: Secondary | ICD-10-CM

## 2015-07-22 ENCOUNTER — Ambulatory Visit
Admission: RE | Admit: 2015-07-22 | Discharge: 2015-07-22 | Disposition: A | Discharge status: Home / Self Care | Payer: 59 | Source: Ambulatory Visit | Attending: Endocrinology | Admitting: Endocrinology

## 2015-07-22 DIAGNOSIS — E041 Nontoxic single thyroid nodule: Secondary | ICD-10-CM

## 2015-07-23 ENCOUNTER — Other Ambulatory Visit: Payer: Self-pay

## 2015-07-23 ENCOUNTER — Encounter: Payer: Self-pay | Admitting: Internal Medicine

## 2015-07-23 DIAGNOSIS — Z9889 Other specified postprocedural states: Secondary | ICD-10-CM

## 2015-07-23 DIAGNOSIS — Z1231 Encounter for screening mammogram for malignant neoplasm of breast: Secondary | ICD-10-CM

## 2015-08-15 ENCOUNTER — Ambulatory Visit
Admission: RE | Admit: 2015-08-15 | Discharge: 2015-08-15 | Disposition: A | Discharge status: Home / Self Care | Payer: 59 | Source: Ambulatory Visit

## 2015-08-15 DIAGNOSIS — Z9889 Other specified postprocedural states: Secondary | ICD-10-CM

## 2015-08-15 DIAGNOSIS — Z1231 Encounter for screening mammogram for malignant neoplasm of breast: Secondary | ICD-10-CM

## 2015-08-17 ENCOUNTER — Other Ambulatory Visit: Payer: Self-pay | Admitting: Physician Assistant

## 2015-08-18 NOTE — Telephone Encounter (Signed)
Do you want to RF or RTC?

## 2015-09-12 ENCOUNTER — Ambulatory Visit (AMBULATORY_SURGERY_CENTER): Payer: Self-pay

## 2015-09-12 ENCOUNTER — Encounter: Payer: Self-pay | Admitting: Internal Medicine

## 2015-09-12 VITALS — Ht 65.0 in | Wt 182.0 lb

## 2015-09-12 DIAGNOSIS — Z1211 Encounter for screening for malignant neoplasm of colon: Secondary | ICD-10-CM

## 2015-09-12 MED ORDER — SUPREP BOWEL PREP KIT 17.5-3.13-1.6 GM/177ML PO SOLN: 1.0000 | Freq: Once | ORAL | Status: DC

## 2015-09-12 NOTE — Progress Notes (Signed)
No allergies to eggs or soy No diet meds No past problems with anesthesia No home oxygen  Has email and internet; registered emmi

## 2015-09-26 ENCOUNTER — Encounter: Payer: Self-pay | Admitting: Internal Medicine

## 2015-09-26 ENCOUNTER — Ambulatory Visit (AMBULATORY_SURGERY_CENTER): Payer: Managed Care, Other (non HMO) | Admitting: Internal Medicine

## 2015-09-26 VITALS — BP 164/83 | HR 71 | Temp 97.5°F | Resp 13 | Ht 65.0 in | Wt 182.0 lb

## 2015-09-26 DIAGNOSIS — Z1211 Encounter for screening for malignant neoplasm of colon: Secondary | ICD-10-CM | POA: Diagnosis not present

## 2015-09-26 MED ORDER — SODIUM CHLORIDE 0.9 % IV SOLN: 500.0000 mL | INTRAVENOUS | Status: DC

## 2015-09-26 NOTE — Progress Notes (Signed)
Report given to PACU RN, vss 

## 2015-09-26 NOTE — Op Note (Signed)
Katelyn Salinas: [name] Procedure Date: 09/26/2015 10:57 AM MRN: [id number] Endoscopist: Docia Chuck. Katelyn Salinas , MD Age: 55 Referring MD:  Date of Birth: Sep 06, 1960 Gender: Female Procedure:                Colonoscopy Indications:              Screening for colorectal malignant neoplasm Medicines:                Monitored Anesthesia Care Procedure:                Pre-Anesthesia Assessment:                           - Prior to the procedure, a History and Physical                            was performed, and patient medications and                            allergies were reviewed. The patient's tolerance of                            previous anesthesia was also reviewed. The risks                            and benefits of the procedure and the sedation                            options and risks were discussed with the patient.                            All questions were answered, and informed consent                            was obtained. Prior Anticoagulants: The patient has                            taken no previous anticoagulant or antiplatelet                            agents. ASA Grade Assessment: II - A patient with                            mild systemic disease. After reviewing the risks                            and benefits, the patient was deemed in                            satisfactory condition to undergo the procedure.                            Digital rectal exam was performed and found to be  unremarkable.                           After obtaining informed consent, the colonoscope                            was passed under direct vision. Throughout the                            procedure, the patient's blood pressure, pulse, and                            oxygen saturations were monitored continuously. The                            Model CF-HQ190L [id number]) scope was introduced          through the anus and advanced to the the cecum,                            identified by appendiceal orifice and ileocecal                            valve. The ileocecal valve, appendiceal orifice,                            and rectum were photographed. The quality of the                            bowel preparation was excellent. The colonoscopy                            was performed without difficulty. The patient                            tolerated the procedure well. The bowel preparation                            used was SUPREP. Scope In: 11:05:34 AM Scope Out: 11:18:21 AM Scope Withdrawal Time: 0 hours 10 minutes 39 seconds  Total Procedure Duration: 0 hours 12 minutes 47 seconds  Findings:                 Multiple diverticula were found in the right colon.                           The exam was otherwise without abnormality on                            direct views. Narrow rectal vault prohibited                            retroflexion. Good antegrade views from anal verge                            however. Complications:  No immediate complications. Estimated blood loss:                            None. Estimated Blood Loss:     Estimated blood loss: none. Impression:               - Diverticulosis in the right colon.                           - The examination was otherwise normal. Recommendation:           - Repeat colonoscopy in 10 years for screening                            purposes. Docia Chuck. Katelyn Pastor, MD 09/26/2015 11:25:12 AM This report has been signed electronically. CC Letter to:             Katelyn Nicks, MD

## 2015-09-26 NOTE — Patient Instructions (Signed)
YOU HAD AN ENDOSCOPIC PROCEDURE TODAY AT [facility]:   Refer to the procedure report that was given to you for any specific questions about what was found during the examination.  If the procedure report does not answer your questions, please call your gastroenterologist to clarify.  If you requested that your care partner not be given the details of your procedure findings, then the procedure report has been included in a sealed envelope for you to review at your convenience later.  YOU SHOULD EXPECT: Some feelings of bloating in the abdomen. Passage of more gas than usual.  Walking can help get rid of the air that was put into your GI tract during the procedure and reduce the bloating. If you had a lower endoscopy (such as a colonoscopy or flexible sigmoidoscopy) you may notice spotting of blood in your stool or on the toilet paper. If you underwent a bowel prep for your procedure, you may not have a normal bowel movement for a few days.  Please Note:  You might notice some irritation and congestion in your nose or some drainage.  This is from the oxygen used during your procedure.  There is no need for concern and it should clear up in a day or so.  SYMPTOMS TO REPORT IMMEDIATELY:   Following lower endoscopy (colonoscopy or flexible sigmoidoscopy):  Excessive amounts of blood in the stool  Significant tenderness or worsening of abdominal pains  Swelling of the abdomen that is new, acute  Fever of 100F or higher    For urgent or emergent issues, a gastroenterologist can be reached at any hour by calling [phone].   DIET: Your first meal following the procedure should be a small meal and then it is ok to progress to your normal diet. Heavy or fried foods are harder to digest and may make you feel nauseous or bloated.  Likewise, meals heavy in dairy and vegetables can increase bloating.  Drink plenty of fluids but you should avoid alcoholic beverages for 24  hours.  ACTIVITY:  You should plan to take it easy for the rest of today and you should NOT DRIVE or use heavy machinery until tomorrow (because of the sedation medicines used during the test).    FOLLOW UP: Our staff will call the number listed on your records the next business day following your procedure to check on you and address any questions or concerns that you may have regarding the information given to you following your procedure. If we do not reach you, we will leave a message.  However, if you are feeling well and you are not experiencing any problems, there is no need to return our call.  We will assume that you have returned to your regular daily activities without incident.  If any biopsies were taken you will be contacted by phone or by letter within the next 1-3 weeks.  Please call us at [phone] if you have not heard about the biopsies in 3 weeks.    SIGNATURES/CONFIDENTIALITY: You and/or your care partner have signed paperwork which will be entered into your electronic medical record.  These signatures attest to the fact that that the information above on your After Visit Summary has been reviewed and is understood.  Full responsibility of the confidentiality of this discharge information lies with you and/or your care-partner.  Diverticulosis, high fiber diet-handouts given  Repeat colonoscopy in 10 years 2027.

## 2015-09-29 ENCOUNTER — Telehealth: Payer: Self-pay

## 2015-09-29 NOTE — Telephone Encounter (Signed)
Left a message at [phone] on her voice mail, for the pt to call us back if any questions or concerns. maw

## 2016-05-22 ENCOUNTER — Emergency Department (HOSPITAL_COMMUNITY)
Admission: EM | Admit: 2016-05-22 | Discharge: 2016-05-22 | Disposition: A | Discharge status: Home / Self Care | Payer: 59 | Attending: Emergency Medicine | Admitting: Emergency Medicine

## 2016-05-22 ENCOUNTER — Encounter (HOSPITAL_COMMUNITY): Payer: Self-pay | Admitting: Emergency Medicine

## 2016-05-22 DIAGNOSIS — N939 Abnormal uterine and vaginal bleeding, unspecified: Secondary | ICD-10-CM | POA: Diagnosis not present

## 2016-05-22 DIAGNOSIS — I10 Essential (primary) hypertension: Secondary | ICD-10-CM | POA: Diagnosis not present

## 2016-05-22 DIAGNOSIS — Z79899 Other long term (current) drug therapy: Secondary | ICD-10-CM | POA: Diagnosis not present

## 2016-05-22 DIAGNOSIS — J45909 Unspecified asthma, uncomplicated: Secondary | ICD-10-CM | POA: Insufficient documentation

## 2016-05-22 LAB — URINALYSIS, ROUTINE W REFLEX MICROSCOPIC
BACTERIA UA: NONE SEEN
Bilirubin Urine: NEGATIVE
Glucose, UA: NEGATIVE mg/dL
KETONES UR: NEGATIVE mg/dL
Leukocytes, UA: NEGATIVE
Nitrite: NEGATIVE
PROTEIN: NEGATIVE mg/dL
Specific Gravity, Urine: 1.021 (ref 1.005–1.030)
pH: 5 (ref 5.0–8.0)

## 2016-05-22 LAB — CBC
HCT: 30.5 % — ABNORMAL LOW (ref 36.0–46.0)
HEMOGLOBIN: 10.1 g/dL — AB (ref 12.0–15.0)
MCH: 27.9 pg (ref 26.0–34.0)
MCHC: 33.1 g/dL (ref 30.0–36.0)
MCV: 84.3 fL (ref 78.0–100.0)
PLATELETS: 236 10*3/uL (ref 150–400)
RBC: 3.62 MIL/uL — AB (ref 3.87–5.11)
RDW: 14.3 % (ref 11.5–15.5)
WBC: 5.9 10*3/uL (ref 4.0–10.5)

## 2016-05-22 LAB — COMPREHENSIVE METABOLIC PANEL
ALT: 17 U/L (ref 14–54)
AST: 25 U/L (ref 15–41)
Albumin: 3.5 g/dL (ref 3.5–5.0)
Alkaline Phosphatase: 48 U/L (ref 38–126)
Anion gap: 6 (ref 5–15)
BUN: 17 mg/dL (ref 6–20)
CHLORIDE: 102 mmol/L (ref 101–111)
CO2: 29 mmol/L (ref 22–32)
CREATININE: 0.8 mg/dL (ref 0.44–1.00)
Calcium: 9.2 mg/dL (ref 8.9–10.3)
GFR calc non Af Amer: >60 mL/min (ref >60)
Glucose, Bld: 98 mg/dL (ref 65–99)
Potassium: 4.2 mmol/L (ref 3.5–5.1)
SODIUM: 137 mmol/L (ref 135–145)
Total Bilirubin: 0.7 mg/dL (ref 0.3–1.2)
Total Protein: 7.8 g/dL (ref 6.5–8.1)

## 2016-05-22 LAB — I-STAT BETA HCG BLOOD, ED (MC, WL, AP ONLY): I-stat hCG, quantitative: <5 m[IU]/mL (ref <5)

## 2016-05-22 LAB — LIPASE, BLOOD: LIPASE: 25 U/L (ref 11–51)

## 2016-05-22 MED ORDER — TRAMADOL HCL 50 MG PO TABS: 50.0000 mg | ORAL_TABLET | Freq: Four times a day (QID) | ORAL | 0 refills | Status: DC | PRN

## 2016-05-22 NOTE — Discharge Instructions (Signed)
Follow-up with your OB/GYN doctor in 1-2 weeks. Take Tylenol or Motrin for pain. Use the Ultram if necessary

## 2016-05-22 NOTE — ED Triage Notes (Addendum)
On or around December 30th Pt began to bleed pretty heavily from vagina with large amount of clotting.  This has continued everyday since that time with some pain in back and abdomen that comes and goes.  Pt reports feeling tired.

## 2016-05-22 NOTE — ED Provider Notes (Signed)
[facility] DEPT Provider Note   CSN: [id number] Arrival date & time: 05/22/16  1610     History   Chief Complaint Chief Complaint  Patient presents with  . Vaginal Bleeding    HPI Katelyn Salinas is a 56 y.o. female.  Patient complains of vaginal bleeding for over a month. She is presently taking estrogen and Provera. She has a history of fibroids.    Vaginal Bleeding  Primary symptoms include vaginal bleeding. There has been no fever. This is a new problem. The current episode started more than 1 week ago. The problem occurs constantly. The problem has not changed since onset.The symptoms occur spontaneously. She is not pregnant. The patient's menstrual history has been irregular. Pertinent negatives include no abdominal pain, no diarrhea and no frequency.    Past Medical History:  Diagnosis Date  . Adenomyosis   . Asthma   . Blood transfusion without reported diagnosis   . Fibroid   . Hypertension   . PID (acute pelvic inflammatory disease) 1994    Patient Active Problem List   Diagnosis Date Noted  . BMI 30.0-30.9,adult 01/13/2015  . Axillary mass 04/05/2013  . Ovarian cyst, right 04/04/2013  . History of cervical dysplasia 01/16/2013  . Fibroid uterus 01/16/2013  . Postmenopausal HRT (hormone replacement therapy) 01/16/2013    Past Surgical History:  Procedure Laterality Date  . BREAST SURGERY     RIGHT BREAST LUMP; BREAST REDUCTION IN 2006   . LASER ABLATION OF THE CERVIX  1989   CIN-1  . TUBAL LIGATION  1989    OB History    Gravida Para Term Preterm AB Living   1 1 1     1    SAB TAB Ectopic Multiple Live Births                   Home Medications    Prior to Admission medications   Medication Sig Start Date End Date Taking? Authorizing Provider  ALPRAZolam (XANAX) 0.25 MG tablet Take 0.25 mg by mouth 3 (three) times daily. 03/25/16  Yes Historical Provider, MD  desvenlafaxine (PRISTIQ) 100 MG 24 hr tablet Take 100 mg by mouth daily.    Yes Historical Provider, MD  estradiol (ESTRACE) 1 MG tablet Take 1 tablet (1 mg total) by mouth daily. 06/25/15  Yes Anastasio Auerbach, MD  fish oil-omega-3 fatty acids 1000 MG capsule Take 1 g by mouth daily.   Yes Historical Provider, MD  fluticasone (FLONASE) 50 MCG/ACT nasal spray Place 2 sprays into the nose daily. Patient taking differently: Place 2 sprays into the nose as needed.  01/10/13  Yes Roselee Culver, MD  ibuprofen (ADVIL,MOTRIN) 200 MG tablet Take 400 mg by mouth every 6 (six) hours as needed.   Yes Historical Provider, MD  latanoprost (XALATAN) 0.005 % ophthalmic solution Place 1 drop into both eyes at bedtime.   Yes Historical Provider, MD  Multiple Vitamin (MULTIVITAMIN PO) Take 1 tablet by mouth daily.    Yes Historical Provider, MD  progesterone (PROMETRIUM) 100 MG capsule Take 1 capsule (100 mg total) by mouth at bedtime. 06/25/15  Yes Anastasio Auerbach, MD  valsartan-hydrochlorothiazide (DIOVAN-HCT) 160-12.5 MG tablet Take 1 tablet by mouth daily. 05/06/16  Yes Historical Provider, MD  clotrimazole-betamethasone (LOTRISONE) cream APPLY 1 APPLICATION TOPICALLY 2 (TWO) TIMES DAILY. Patient not taking: Reported on 05/22/2016 08/19/15   Tereasa Coop, PA-C  traMADol (ULTRAM) 50 MG tablet Take 1 tablet (50 mg total) by mouth every 6 (six)  hours as needed. 05/22/16   Milton Ferguson, MD    Family History Family History  Problem Relation Age of Onset  . Hypertension Mother   . Hypertension Father   . Heart disease Father   . Hypertension Brother   . Heart disease Brother   . Alzheimer's disease Paternal Grandmother   . Colon cancer Neg Hx     Social History Social History  Substance Use Topics  . Smoking status: Never Smoker  . Smokeless tobacco: Never Used  . Alcohol use No     Allergies   Patient has no known allergies.   Review of Systems Review of Systems  Constitutional: Negative for appetite change and fatigue.  HENT: Negative for congestion, ear  discharge and sinus pressure.   Eyes: Negative for discharge.  Respiratory: Negative for cough.   Cardiovascular: Negative for chest pain.  Gastrointestinal: Negative for abdominal pain and diarrhea.  Genitourinary: Positive for vaginal bleeding. Negative for frequency and hematuria.  Musculoskeletal: Negative for back pain.  Skin: Negative for rash.  Neurological: Negative for seizures and headaches.  Psychiatric/Behavioral: Negative for hallucinations.     Physical Exam Updated Vital Signs BP 162/84   Pulse 81   Temp 98.6 F (37 C)   Resp 16   Ht 5\' 5"  (1.651 m)   Wt 180 lb (81.6 kg)   LMP 03/22/2016 Comment: This is the last normal cycle   SpO2 98%   BMI 29.95 kg/m   Physical Exam  Constitutional: She is oriented to person, place, and time. She appears well-developed.  HENT:  Head: Normocephalic.  Eyes: Conjunctivae and EOM are normal. No scleral icterus.  Neck: Neck supple. No thyromegaly present.  Cardiovascular: Normal rate and regular rhythm.  Exam reveals no gallop and no friction rub.   No murmur heard. Pulmonary/Chest: No stridor. She has no wheezes. She has no rales. She exhibits no tenderness.  Abdominal: She exhibits no distension. There is no tenderness. There is no rebound.  Genitourinary:  Genitourinary Comments: Moderate amount of blood in vault. Also an enlarged uterus  Musculoskeletal: Normal range of motion. She exhibits no edema.  Lymphadenopathy:    She has no cervical adenopathy.  Neurological: She is oriented to person, place, and time. She exhibits normal muscle tone. Coordination normal.  Skin: No rash noted. No erythema.  Psychiatric: She has a normal mood and affect. Her behavior is normal.     ED Treatments / Results  Labs (all labs ordered are listed, but only abnormal results are displayed) Labs Reviewed  CBC - Abnormal; Notable for the following:       Result Value   RBC 3.62 (*)    Hemoglobin 10.1 (*)    HCT 30.5 (*)    All  other components within normal limits  URINALYSIS, ROUTINE W REFLEX MICROSCOPIC - Abnormal; Notable for the following:    Hgb urine dipstick LARGE (*)    Squamous Epithelial / LPF 0-5 (*)    All other components within normal limits  LIPASE, BLOOD  COMPREHENSIVE METABOLIC PANEL  I-STAT BETA HCG BLOOD, ED ([facility], [facility], [facility] ONLY)    EKG  EKG Interpretation None       Radiology No results found.  Procedures Procedures (including critical care time)  Medications Ordered in ED Medications - No data to display   Initial Impression / Assessment and Plan / ED Course  I have reviewed the triage vital signs and the nursing notes.  Pertinent labs & imaging results that were available  during my care of the patient were reviewed by me and considered in my medical decision making (see chart for details).     Patient with a history of uterine fibroids and vaginal bleeding. Labs unremarkable. Patient will follow-up with GYN  Final Clinical Impressions(s) / ED Diagnoses   Final diagnoses:  Vaginal bleeding    New Prescriptions New Prescriptions   TRAMADOL (ULTRAM) 50 MG TABLET    Take 1 tablet (50 mg total) by mouth every 6 (six) hours as needed.     Milton Ferguson, MD 05/22/16 2111

## 2016-05-24 ENCOUNTER — Ambulatory Visit (INDEPENDENT_AMBULATORY_CARE_PROVIDER_SITE_OTHER): Payer: 59 | Admitting: Gynecology

## 2016-05-24 ENCOUNTER — Encounter: Payer: Self-pay | Admitting: Gynecology

## 2016-05-24 VITALS — BP 130/76

## 2016-05-24 DIAGNOSIS — N921 Excessive and frequent menstruation with irregular cycle: Secondary | ICD-10-CM | POA: Diagnosis not present

## 2016-05-24 MED ORDER — MEGESTROL ACETATE 20 MG PO TABS: 20.0000 mg | ORAL_TABLET | Freq: Every day | ORAL | 1 refills | Status: DC

## 2016-05-24 NOTE — Progress Notes (Addendum)
    Katelyn Salinas 1960-12-13 [id number]        55 y.o.  G1P1001 presents with several month history of bleeding on and off to include passage of large clots. Currently on HRT to include oral Estrace and Prometrium. Had negative sonohysterogram over year ago for cavitary defects and negative endometrial biopsy. Ultrasound showed multiple myomas.  Past medical history,surgical history, problem list, medications, allergies, family history and social history were all reviewed and documented in the EPIC chart.  Directed ROS with pertinent positives and negatives documented in the history of present illness/assessment and plan.  Exam: [name] assistant Vitals:   05/24/16 1028  BP: 130/76   General appearance:  Normal Abdomen soft nontender without masses guarding rebound Pelvic external BUS vagina with slight menses flow. Cervix grossly normal. Uterus bulky 14 week size midline mobile. Adnexa without gross masses  Assessment/Plan:  56 y.o. G1P1001 with history of leiomyoma and irregular bleeding. Most recently has been bleeding for several months. Hemoglobin from ER visit 2 days ago 10. We'll start Megace 20 mg twice a day until bleeding stops and then every day. Follow up for ultrasound rule out other pelvic pathology besides leiomyoma. Options for long-term intermittent reviewed to include continued attempt at hormonal manipulation, hysteroscopy with resection of any intracavitary defects, endometrial ablation, uterine artery embolization, myomectomy, hysterectomy. Patient is frustrated and tired of bleeding on and off with large clots. Bleeding a year ago and now has returned. She wants to move toward scheduling hysterectomy. Because of the bulk of the uterus and poor dissent feel abdominal approaches most prudent and she agrees with this. Recheck hemoglobin in a month to hopefully allow for hemoglobin recovery period start on daily iron now. Risks generally reviewed to include the risk of  transfusion particular if her hemoglobin continues low    Anastasio Auerbach MD, 10:43 AM 05/24/2016

## 2016-05-24 NOTE — Patient Instructions (Addendum)
Office will call you to arrange surgery  Return to have your blood count rechecked in several weeks.

## 2016-06-03 ENCOUNTER — Other Ambulatory Visit: Payer: Self-pay | Admitting: Gynecology

## 2016-06-03 ENCOUNTER — Encounter: Payer: Self-pay | Admitting: Gynecology

## 2016-06-03 ENCOUNTER — Ambulatory Visit (INDEPENDENT_AMBULATORY_CARE_PROVIDER_SITE_OTHER): Payer: 59

## 2016-06-03 ENCOUNTER — Ambulatory Visit (INDEPENDENT_AMBULATORY_CARE_PROVIDER_SITE_OTHER): Payer: 59 | Admitting: Gynecology

## 2016-06-03 VITALS — BP 120/70

## 2016-06-03 DIAGNOSIS — N921 Excessive and frequent menstruation with irregular cycle: Secondary | ICD-10-CM | POA: Diagnosis not present

## 2016-06-03 DIAGNOSIS — D251 Intramural leiomyoma of uterus: Secondary | ICD-10-CM

## 2016-06-03 DIAGNOSIS — D259 Leiomyoma of uterus, unspecified: Secondary | ICD-10-CM

## 2016-06-03 DIAGNOSIS — N852 Hypertrophy of uterus: Secondary | ICD-10-CM

## 2016-06-03 NOTE — Patient Instructions (Signed)
Follow-up with your decision as far as management 

## 2016-06-03 NOTE — Progress Notes (Signed)
    Katelyn Salinas Apr 09, 1961 [id number]        56 y.o.  G1P1001 presents for ultrasound and management discussion. She is here with her husband. heavy irregular menses with history of leiomyoma. Sonohysterogram with negative endometrial cavity and proliferative endometrium on biopsy  Past medical history,surgical history, problem list, medications, allergies, family history and social history were all reviewed and documented in the EPIC chart.  Directed ROS with pertinent positives and negatives documented in the history of present illness/assessment and plan.  Exam: Vitals:   06/03/16 1429  BP: 120/70   General appearance:  Normal  Ultrasound shows uterus enlarged with multiple myomas measuring 31 mm, 27 mm, 25 mm. Endometrial echo 10 mm. Right ovary normal. Left ovary not visualized, no pathology in the adnexa noted. Cul-de-sac negative.  Assessment/Plan:  56 y.o. G1P1001 with persistent irregular bleeding and leiomyoma. Had discussed various options previously 05/24/2016. I again reviewed the options with the patient and her husband. I discussed her age at 3 being in the perimenopausal range and options to try to temporize her through menopause either with hormonal manipulation, possible Mirena IUD, endometrial ablation. More aggressive approaches to include uterine artery embolization also reviewed. Surgical options including multiple myomectomy up to including hysterectomy discussed. If we proceed with hysterectomy the ovarian conservation issue was reviewed. Being that she currently is on HRT my recommendation would be to remove her ovaries and fallopian tubes as a risk reductive surgery and just hormones afterwards as needed. I also reviewed the approach. Her prior exam showed her uterus to feel somewhat bulky and well supported making vaginal hysterectomy difficult. I also believe an LAVH approach would be difficult. Options for referral and consideration for robotic surgery versus  proceeding with it TAH/BSO discussed. The patient and her husband want to think of their options and she will follow up with their decision over the next several days.  Greater than 50% of my time was spent in direct face to face counseling and coordination of care with the patient.     Anastasio Auerbach MD, 2:59 PM 06/03/2016

## 2016-06-08 ENCOUNTER — Telehealth: Payer: Self-pay

## 2016-06-08 NOTE — Telephone Encounter (Signed)
Patient called to let you know that she has decided to proceed with surgery as scheduled for 07/13/16.

## 2016-06-08 NOTE — Telephone Encounter (Signed)
Okay 

## 2016-06-29 ENCOUNTER — Other Ambulatory Visit: Payer: 59

## 2016-06-29 DIAGNOSIS — N921 Excessive and frequent menstruation with irregular cycle: Secondary | ICD-10-CM

## 2016-06-29 LAB — CBC WITH DIFFERENTIAL/PLATELET
BASOS ABS: 0 {cells}/uL (ref 0–200)
BASOS PCT: 0 %
Eosinophils Absolute: 96 cells/uL (ref 15–500)
Eosinophils Relative: 2 %
HEMATOCRIT: 31.5 % — AB (ref 35.0–45.0)
Hemoglobin: 10.2 g/dL — ABNORMAL LOW (ref 11.7–15.5)
LYMPHS PCT: 52 %
Lymphs Abs: 2496 cells/uL (ref 850–3900)
MCH: 27.3 pg (ref 27.0–33.0)
MCHC: 32.4 g/dL (ref 32.0–36.0)
MCV: 84.5 fL (ref 80.0–100.0)
MONO ABS: 240 {cells}/uL (ref 200–950)
MPV: 9.2 fL (ref 7.5–12.5)
Monocytes Relative: 5 %
NEUTROS PCT: 41 %
Neutro Abs: 1968 cells/uL (ref 1500–7800)
Platelets: 296 10*3/uL (ref 140–400)
RBC: 3.73 MIL/uL — ABNORMAL LOW (ref 3.80–5.10)
RDW: 15.3 % — AB (ref 11.0–15.0)
WBC: 4.8 10*3/uL (ref 3.8–10.8)

## 2016-07-05 NOTE — Patient Instructions (Signed)
Your procedure is scheduled on:  Tuesday, July 13, 2016  Enter through the Main Entrance of [facility] at:  6:00 AM  Pick up the phone at the desk and dial [phone].  Call this number if you have problems the morning of surgery: [phone].  Remember: Do NOT eat food or drink after:  Midnight Monday  Take these medicines the morning of surgery with a SIP OF WATER:  Pristiq, Valsartan, Xanax if needed  Stop ALL herbal medications and Ibuprofen at this time  Do NOT smoke the day of surgery.  Do NOT wear jewelry (body piercing), metal hair clips/bobby pins, make-up, or nail polish. Do NOT wear lotions, powders, or perfumes.  You may wear deodorant. Do NOT shave for 48 hours prior to surgery. Do NOT bring valuables to the hospital. Contacts, dentures, or bridgework may not be worn into surgery.  Leave suitcase in car.  After surgery it may be brought to your room.  For patients admitted to the hospital, checkout time is 11:00 AM the day of discharge.  Bring a copy of your healthcare power of attorney and living will documents.  **Effective Friday, Jan. 12, 2018, [facility] will implement no hospital visitations from children age 85 and younger due to a steady increase in flu activity in our community and hospitals. **

## 2016-07-06 ENCOUNTER — Other Ambulatory Visit: Payer: Self-pay

## 2016-07-06 ENCOUNTER — Ambulatory Visit (INDEPENDENT_AMBULATORY_CARE_PROVIDER_SITE_OTHER): Payer: 59 | Admitting: Gynecology

## 2016-07-06 ENCOUNTER — Encounter (HOSPITAL_COMMUNITY)
Admission: RE | Admit: 2016-07-06 | Discharge: 2016-07-06 | Disposition: A | Discharge status: Home / Self Care | Payer: 59 | Source: Ambulatory Visit | Attending: Gynecology | Admitting: Gynecology

## 2016-07-06 ENCOUNTER — Encounter: Payer: Self-pay | Admitting: Gynecology

## 2016-07-06 ENCOUNTER — Encounter (HOSPITAL_COMMUNITY): Payer: Self-pay

## 2016-07-06 VITALS — BP 136/72

## 2016-07-06 DIAGNOSIS — R2230 Localized swelling, mass and lump, unspecified upper limb: Secondary | ICD-10-CM | POA: Diagnosis not present

## 2016-07-06 DIAGNOSIS — Z87410 Personal history of cervical dysplasia: Secondary | ICD-10-CM | POA: Diagnosis not present

## 2016-07-06 DIAGNOSIS — I10 Essential (primary) hypertension: Secondary | ICD-10-CM | POA: Diagnosis not present

## 2016-07-06 DIAGNOSIS — Z01812 Encounter for preprocedural laboratory examination: Secondary | ICD-10-CM | POA: Diagnosis not present

## 2016-07-06 DIAGNOSIS — D259 Leiomyoma of uterus, unspecified: Secondary | ICD-10-CM | POA: Diagnosis not present

## 2016-07-06 DIAGNOSIS — Z01810 Encounter for preprocedural cardiovascular examination: Secondary | ICD-10-CM | POA: Diagnosis present

## 2016-07-06 DIAGNOSIS — N739 Female pelvic inflammatory disease, unspecified: Secondary | ICD-10-CM | POA: Diagnosis not present

## 2016-07-06 DIAGNOSIS — N83201 Unspecified ovarian cyst, right side: Secondary | ICD-10-CM | POA: Insufficient documentation

## 2016-07-06 DIAGNOSIS — D500 Iron deficiency anemia secondary to blood loss (chronic): Secondary | ICD-10-CM

## 2016-07-06 DIAGNOSIS — D649 Anemia, unspecified: Secondary | ICD-10-CM | POA: Diagnosis not present

## 2016-07-06 DIAGNOSIS — N921 Excessive and frequent menstruation with irregular cycle: Secondary | ICD-10-CM | POA: Diagnosis not present

## 2016-07-06 HISTORY — DX: Anemia, unspecified: D64.9

## 2016-07-06 LAB — COMPREHENSIVE METABOLIC PANEL
ALBUMIN: 3.5 g/dL (ref 3.5–5.0)
ALK PHOS: 46 U/L (ref 38–126)
ALT: 16 U/L (ref 14–54)
AST: 18 U/L (ref 15–41)
Anion gap: 9 (ref 5–15)
BILIRUBIN TOTAL: 0.5 mg/dL (ref 0.3–1.2)
BUN: 14 mg/dL (ref 6–20)
CO2: 25 mmol/L (ref 22–32)
CREATININE: 0.86 mg/dL (ref 0.44–1.00)
Calcium: 8.8 mg/dL — ABNORMAL LOW (ref 8.9–10.3)
Chloride: 101 mmol/L (ref 101–111)
GFR calc Af Amer: >60 mL/min (ref >60)
GFR calc non Af Amer: >60 mL/min (ref >60)
GLUCOSE: 101 mg/dL — AB (ref 65–99)
POTASSIUM: 3.8 mmol/L (ref 3.5–5.1)
Sodium: 135 mmol/L (ref 135–145)
TOTAL PROTEIN: 7.5 g/dL (ref 6.5–8.1)

## 2016-07-06 LAB — CBC
HEMATOCRIT: 29.8 % — AB (ref 36.0–46.0)
HEMOGLOBIN: 9.7 g/dL — AB (ref 12.0–15.0)
MCH: 27.9 pg (ref 26.0–34.0)
MCHC: 32.6 g/dL (ref 30.0–36.0)
MCV: 85.6 fL (ref 78.0–100.0)
Platelets: 253 10*3/uL (ref 150–400)
RBC: 3.48 MIL/uL — AB (ref 3.87–5.11)
RDW: 15.1 % (ref 11.5–15.5)
WBC: 5.2 10*3/uL (ref 4.0–10.5)

## 2016-07-06 NOTE — Progress Notes (Signed)
Katelyn Salinas October 18, 1960 [id number]   Preoperative counseling   Chief complaint: Irregular bleeding, leiomyoma  History of present illness: 56 y.o. G1P1001 with over one year history of irregular bleeding with passage of large clots and anemia associated with her blood loss. Exam shows enlarged uterus with multiple myomas documented on ultrasound. Sonohysterogram showed no intracavitary defects with a negative endometrial biopsy last year. Most recent ultrasound confirms multiple myomas without adnexal pathology noted. Has been on Megace multiple times with continued irregular bleeding. Options for management reviewed with the patient to include hormonal manipulation, Mirena IUD, hysteroscopy with resection of any intracavitary defects, endometrial ablation, uterine artery embolization, myomectomy and hysterectomy. After considering all of the options the patient wants to proceed with hysterectomy. Due to the bulk of the uterus and support high in the pelvis it was felt most prudent to approach with a total abdominal incision. Options for consideration for robotic approach was reviewed and offered and the patient declined. Currently on HRT for menopausal symptom relief and she wants to proceed with a BSO also with her hysterectomy. Status post BTL in the past.  Past medical history,surgical history, medications, allergies, family history and social history were all reviewed and documented in the EPIC chart.  ROS:  Was performed and pertinent positives and negatives are included in the history of present illness.  Exam:  Copywriter, advertising Vitals:   07/06/16 1525  BP: 136/72   General: well developed, well nourished female, no acute distress HEENT: normal  Lungs: clear to auscultation without wheezing, rales or rhonchi  Cardiac: regular rate without rubs, murmurs or gallops  Abdomen: soft, nontender without masses, guarding, rebound, organomegaly  Pelvic: external bus vagina: normal   Cervix:  grossly normal  Uterus: 14 week size irregular bulky midline  Adnexa: without masses or tenderness    Assessment/Plan:  56 y.o. G1P1001 with history as above. For planned TAH/BSO. The expected intraoperative and postoperative courses as well as the recovery period were reviewed. I reviewed the absolute and irreversible sterility associated with hysterectomy as well as sexuality following hysterectomy and the possibility of persistent orgasmic dysfunction and persistent dyspareunia following the procedure. The risks of infection, prolonged antibiotics, reoperation for abscess or hematoma formation was discussed. The  realistic risk of transfusion, noting her most recent hemoglobin of 9.7 despite extra iron supplement and Megace menstrual suppression and the risks of transfusion reaction, hepatitis, HIV, mad cow disease and other unknown entities was also discussed. Incisional complications to include opening and draining of incisions and closure by secondary intention, dehiscence and long-term issues of keloid/cosmetics and hernia formation were reviewed. The risk of inadvertent injury to internal organs including bowel, bladder, ureters, vessels, nerves either immediately recognized or delay recognized necessitating major exploratory reparative surgeries and future reparative surgeries including bowel resection, ostomy formation, bladder repair, ureteral damage repair was discussed with her. The patient's questions were answered to her satisfaction and she is ready to proceed with surgery.   Anastasio Auerbach MD, 4:29 PM 07/06/2016

## 2016-07-06 NOTE — H&P (Signed)
Ramanda Nudo-Couch [birth year] [id number]   History and Physical  Chief complaint: Irregular bleeding, leiomyoma  History of present illness: 56 y.o. G1P1001 with over one year history of irregular bleeding with passage of large clots and anemia associated with her blood loss. Exam shows enlarged uterus with multiple myomas documented on ultrasound. Sonohysterogram showed no intracavitary defects with a negative endometrial biopsy last year. Most recent ultrasound confirms multiple myomas without adnexal pathology noted. Has been on Megace multiple times with continued irregular bleeding. Options for management reviewed with the patient to include hormonal manipulation, Mirena IUD, hysteroscopy with resection of any intracavitary defects, endometrial ablation, uterine artery embolization, myomectomy and hysterectomy. After considering all of the options the patient wants to proceed with hysterectomy. Due to the bulk of the uterus and support high in the pelvis it was felt most prudent to approach with a total abdominal incision. Options for consideration for robotic approach was reviewed and offered and the patient declined. Currently on HRT for menopausal symptom relief and she wants to proceed with a BSO also with her hysterectomy. Status post BTL in the past.  Past medical history,surgical history, medications, allergies, family history and social history were all reviewed and documented in the EPIC chart.  ROS:  Was performed and pertinent positives and negatives are included in the history of present illness.  Exam:  Copywriter, advertising Vitals:   07/06/16 1525  BP: 136/72   General: well developed, well nourished female, no acute distress HEENT: normal  Lungs: clear to auscultation without wheezing, rales or rhonchi  Cardiac: regular rate without rubs, murmurs or gallops  Abdomen: soft, nontender without masses, guarding, rebound, organomegaly  Pelvic: external bus vagina: normal   Cervix:  grossly normal  Uterus: 14 week size irregular bulky midline  Adnexa: without masses or tenderness    Assessment/Plan:  56 y.o. G1P1001 with history as above. For planned TAH/BSO. The expected intraoperative and postoperative courses as well as the recovery period were reviewed. I reviewed the absolute and irreversible sterility associated with hysterectomy as well as sexuality following hysterectomy and the possibility of persistent orgasmic dysfunction and persistent dyspareunia following the procedure. The risks of infection, prolonged antibiotics, reoperation for abscess or hematoma formation was discussed. The  realistic risk of transfusion, noting her most recent hemoglobin of 9.7 despite extra iron supplement and Megace menstrual suppression and the risks of transfusion reaction, hepatitis, HIV, mad cow disease and other unknown entities was also discussed. Incisional complications to include opening and draining of incisions and closure by secondary intention, dehiscence and long-term issues of keloid/cosmetics and hernia formation were reviewed. The risk of inadvertent injury to internal organs including bowel, bladder, ureters, vessels, nerves either immediately recognized or delay recognized necessitating major exploratory reparative surgeries and future reparative surgeries including bowel resection, ostomy formation, bladder repair, ureteral damage repair was discussed with her. The patient's questions were answered to her satisfaction and she is ready to proceed with surgery.     Anastasio Auerbach MD, 4:40 PM 07/06/2016

## 2016-07-06 NOTE — Patient Instructions (Signed)
Followup for surgery as scheduled. 

## 2016-07-09 ENCOUNTER — Telehealth: Payer: Self-pay

## 2016-07-09 NOTE — Telephone Encounter (Signed)
Patient called with several questions regarding her disability company and insurance. Questions answered. She was also instructed how to obtain med rec release form from our website so that she can provide it to me with me to allow me to complete disability forms and send to company.

## 2016-07-12 ENCOUNTER — Other Ambulatory Visit: Payer: Self-pay | Admitting: Gynecology

## 2016-07-13 ENCOUNTER — Ambulatory Visit (HOSPITAL_COMMUNITY): Payer: 59 | Admitting: Anesthesiology

## 2016-07-13 ENCOUNTER — Encounter (HOSPITAL_COMMUNITY): Payer: Self-pay | Admitting: *Deleted

## 2016-07-13 ENCOUNTER — Encounter (HOSPITAL_COMMUNITY)
Admission: RE | Disposition: A | Discharge status: Home / Self Care | Payer: Self-pay | Source: Ambulatory Visit | Attending: Gynecology

## 2016-07-13 ENCOUNTER — Ambulatory Visit (HOSPITAL_COMMUNITY)
Admission: RE | Admit: 2016-07-13 | Discharge: 2016-07-14 | Disposition: A | Discharge status: Home / Self Care | Payer: 59 | Source: Ambulatory Visit | Attending: Gynecology | Admitting: Gynecology

## 2016-07-13 DIAGNOSIS — N736 Female pelvic peritoneal adhesions (postinfective): Secondary | ICD-10-CM | POA: Insufficient documentation

## 2016-07-13 DIAGNOSIS — D259 Leiomyoma of uterus, unspecified: Secondary | ICD-10-CM | POA: Diagnosis present

## 2016-07-13 DIAGNOSIS — I10 Essential (primary) hypertension: Secondary | ICD-10-CM | POA: Diagnosis not present

## 2016-07-13 DIAGNOSIS — D500 Iron deficiency anemia secondary to blood loss (chronic): Secondary | ICD-10-CM | POA: Insufficient documentation

## 2016-07-13 DIAGNOSIS — D251 Intramural leiomyoma of uterus: Secondary | ICD-10-CM

## 2016-07-13 DIAGNOSIS — J45909 Unspecified asthma, uncomplicated: Secondary | ICD-10-CM | POA: Diagnosis not present

## 2016-07-13 DIAGNOSIS — Z9851 Tubal ligation status: Secondary | ICD-10-CM | POA: Insufficient documentation

## 2016-07-13 DIAGNOSIS — Z8249 Family history of ischemic heart disease and other diseases of the circulatory system: Secondary | ICD-10-CM | POA: Insufficient documentation

## 2016-07-13 DIAGNOSIS — D219 Benign neoplasm of connective and other soft tissue, unspecified: Secondary | ICD-10-CM | POA: Diagnosis present

## 2016-07-13 DIAGNOSIS — N800 Endometriosis of uterus: Secondary | ICD-10-CM | POA: Diagnosis not present

## 2016-07-13 DIAGNOSIS — N939 Abnormal uterine and vaginal bleeding, unspecified: Secondary | ICD-10-CM | POA: Diagnosis not present

## 2016-07-13 HISTORY — PX: SALPINGOOPHORECTOMY: SHX82

## 2016-07-13 HISTORY — PX: ABDOMINAL HYSTERECTOMY: SHX81

## 2016-07-13 LAB — HCG, SERUM, QUALITATIVE: Preg, Serum: NEGATIVE

## 2016-07-13 LAB — ABO/RH: ABO/RH(D): A POS

## 2016-07-13 LAB — TYPE AND SCREEN
ABO/RH(D): A POS
Antibody Screen: NEGATIVE

## 2016-07-13 SURGERY — HYSTERECTOMY, ABDOMINAL
Anesthesia: General | Site: Abdomen

## 2016-07-13 MED ORDER — DIPHENHYDRAMINE HCL 12.5 MG/5ML PO ELIX: 12.5000 mg | ORAL_SOLUTION | Freq: Four times a day (QID) | ORAL | Status: DC | PRN

## 2016-07-13 MED ORDER — HYDROCHLOROTHIAZIDE 12.5 MG PO CAPS
12.5000 mg | ORAL_CAPSULE | Freq: Every day | ORAL | Status: DC
  Administered 2016-07-14: 12.5 mg via ORAL
  Filled 2016-07-13 (×2): qty 1

## 2016-07-13 MED ORDER — 0.9 % SODIUM CHLORIDE (POUR BTL) OPTIME
TOPICAL | Status: DC | PRN
  Administered 2016-07-13: 2000 mL

## 2016-07-13 MED ORDER — SODIUM CHLORIDE 0.9 % IV SOLN: 20.0000 mL | Freq: Once | INTRAVENOUS | Status: DC

## 2016-07-13 MED ORDER — SUGAMMADEX SODIUM 200 MG/2ML IV SOLN
INTRAVENOUS | Status: DC | PRN
  Administered 2016-07-13: 168.8 mg via INTRAVENOUS

## 2016-07-13 MED ORDER — PROPOFOL 10 MG/ML IV BOLUS
INTRAVENOUS | Status: AC
  Filled 2016-07-13: qty 20

## 2016-07-13 MED ORDER — MIDAZOLAM HCL 2 MG/2ML IJ SOLN
INTRAMUSCULAR | Status: AC
  Filled 2016-07-13: qty 2

## 2016-07-13 MED ORDER — CEFOTETAN DISODIUM-DEXTROSE 2-2.08 GM-% IV SOLR
2.0000 g | INTRAVENOUS | Status: AC
  Administered 2016-07-13: 2 g via INTRAVENOUS

## 2016-07-13 MED ORDER — LIDOCAINE HCL (CARDIAC) 20 MG/ML IV SOLN
INTRAVENOUS | Status: DC | PRN
  Administered 2016-07-13: 30 mg via INTRAVENOUS
  Administered 2016-07-13: 70 mg via INTRAVENOUS

## 2016-07-13 MED ORDER — BUPIVACAINE LIPOSOME 1.3 % IJ SUSP
INTRAMUSCULAR | Status: DC | PRN
  Administered 2016-07-13: 30 mL

## 2016-07-13 MED ORDER — SUGAMMADEX SODIUM 200 MG/2ML IV SOLN
INTRAVENOUS | Status: AC
  Filled 2016-07-13: qty 2

## 2016-07-13 MED ORDER — IRBESARTAN 150 MG PO TABS
150.0000 mg | ORAL_TABLET | Freq: Every day | ORAL | Status: DC
  Filled 2016-07-13: qty 1

## 2016-07-13 MED ORDER — KETOROLAC TROMETHAMINE 30 MG/ML IJ SOLN
30.0000 mg | Freq: Four times a day (QID) | INTRAMUSCULAR | Status: DC
  Administered 2016-07-14 (×2): 30 mg via INTRAVENOUS
  Filled 2016-07-13: qty 1

## 2016-07-13 MED ORDER — VENLAFAXINE HCL ER 150 MG PO CP24
150.0000 mg | ORAL_CAPSULE | Freq: Every day | ORAL | Status: DC
  Administered 2016-07-14: 150 mg via ORAL
  Filled 2016-07-13 (×2): qty 1

## 2016-07-13 MED ORDER — KETOROLAC TROMETHAMINE 30 MG/ML IJ SOLN
30.0000 mg | Freq: Four times a day (QID) | INTRAMUSCULAR | Status: DC
  Filled 2016-07-13: qty 1

## 2016-07-13 MED ORDER — SODIUM CHLORIDE 0.9 % IJ SOLN
INTRAMUSCULAR | Status: AC
  Filled 2016-07-13: qty 50

## 2016-07-13 MED ORDER — FENTANYL CITRATE (PF) 100 MCG/2ML IJ SOLN
INTRAMUSCULAR | Status: DC | PRN
  Administered 2016-07-13: 25 ug via INTRAVENOUS
  Administered 2016-07-13 (×5): 50 ug via INTRAVENOUS

## 2016-07-13 MED ORDER — MORPHINE SULFATE 2 MG/ML IV SOLN
INTRAVENOUS | Status: DC
  Administered 2016-07-13: 12 mg via INTRAVENOUS
  Administered 2016-07-13: 12:00:00 via INTRAVENOUS
  Administered 2016-07-13: 3 mg via INTRAVENOUS
  Administered 2016-07-13: 7.5 mg via INTRAVENOUS
  Administered 2016-07-14 (×3): 1.5 mg via INTRAVENOUS
  Filled 2016-07-13: qty 30

## 2016-07-13 MED ORDER — ONDANSETRON HCL 4 MG/2ML IJ SOLN
INTRAMUSCULAR | Status: AC
  Filled 2016-07-13: qty 2

## 2016-07-13 MED ORDER — DEXAMETHASONE SODIUM PHOSPHATE 4 MG/ML IJ SOLN
INTRAMUSCULAR | Status: AC
  Filled 2016-07-13: qty 1

## 2016-07-13 MED ORDER — VASOPRESSIN 20 UNIT/ML IV SOLN
INTRAVENOUS | Status: DC | PRN
  Administered 2016-07-13: 20 mL via INTRAMUSCULAR

## 2016-07-13 MED ORDER — ROCURONIUM BROMIDE 100 MG/10ML IV SOLN
INTRAVENOUS | Status: DC | PRN
  Administered 2016-07-13: 50 mg via INTRAVENOUS
  Administered 2016-07-13: 20 mg via INTRAVENOUS

## 2016-07-13 MED ORDER — ONDANSETRON HCL 4 MG/2ML IJ SOLN
4.0000 mg | Freq: Four times a day (QID) | INTRAMUSCULAR | Status: DC | PRN
  Administered 2016-07-14: 4 mg via INTRAVENOUS
  Filled 2016-07-13: qty 2

## 2016-07-13 MED ORDER — LACTATED RINGERS IV SOLN: INTRAVENOUS | Status: DC

## 2016-07-13 MED ORDER — SCOPOLAMINE 1 MG/3DAYS TD PT72
1.0000 | MEDICATED_PATCH | Freq: Once | TRANSDERMAL | Status: DC
  Administered 2016-07-13: 1.5 mg via TRANSDERMAL

## 2016-07-13 MED ORDER — FENTANYL CITRATE (PF) 100 MCG/2ML IJ SOLN
INTRAMUSCULAR | Status: AC
  Administered 2016-07-13: 50 ug via INTRAVENOUS
  Filled 2016-07-13: qty 2

## 2016-07-13 MED ORDER — PHENYLEPHRINE HCL 10 MG/ML IJ SOLN
INTRAMUSCULAR | Status: DC | PRN
  Administered 2016-07-13: 120 ug via INTRAVENOUS

## 2016-07-13 MED ORDER — BUPIVACAINE LIPOSOME 1.3 % IJ SUSP
20.0000 mL | Freq: Once | INTRAMUSCULAR | Status: DC
  Filled 2016-07-13: qty 20

## 2016-07-13 MED ORDER — DIPHENHYDRAMINE HCL 50 MG/ML IJ SOLN: 12.5000 mg | Freq: Four times a day (QID) | INTRAMUSCULAR | Status: DC | PRN

## 2016-07-13 MED ORDER — DESVENLAFAXINE SUCCINATE ER 100 MG PO TB24
100.0000 mg | ORAL_TABLET | Freq: Every day | ORAL | Status: DC
  Filled 2016-07-13: qty 1

## 2016-07-13 MED ORDER — ONDANSETRON HCL 4 MG/2ML IJ SOLN
INTRAMUSCULAR | Status: DC | PRN
  Administered 2016-07-13: 4 mg via INTRAVENOUS

## 2016-07-13 MED ORDER — DEXTROSE-NACL 5-0.9 % IV SOLN
INTRAVENOUS | Status: DC
  Administered 2016-07-13 – 2016-07-14 (×2): via INTRAVENOUS

## 2016-07-13 MED ORDER — LACTATED RINGERS IV SOLN
INTRAVENOUS | Status: DC
  Administered 2016-07-13 (×3): via INTRAVENOUS

## 2016-07-13 MED ORDER — VALSARTAN-HYDROCHLOROTHIAZIDE 160-12.5 MG PO TABS: 1.0000 | ORAL_TABLET | Freq: Every day | ORAL | Status: DC

## 2016-07-13 MED ORDER — SCOPOLAMINE 1 MG/3DAYS TD PT72
MEDICATED_PATCH | TRANSDERMAL | Status: AC
  Filled 2016-07-13: qty 1

## 2016-07-13 MED ORDER — NALOXONE HCL 0.4 MG/ML IJ SOLN: 0.4000 mg | INTRAMUSCULAR | Status: DC | PRN

## 2016-07-13 MED ORDER — KETOROLAC TROMETHAMINE 30 MG/ML IJ SOLN
INTRAMUSCULAR | Status: AC
  Filled 2016-07-13: qty 1

## 2016-07-13 MED ORDER — FENTANYL CITRATE (PF) 100 MCG/2ML IJ SOLN
25.0000 ug | INTRAMUSCULAR | Status: DC | PRN
  Administered 2016-07-13 (×2): 50 ug via INTRAVENOUS

## 2016-07-13 MED ORDER — CEFOTETAN DISODIUM-DEXTROSE 2-2.08 GM-% IV SOLR
INTRAVENOUS | Status: AC
  Filled 2016-07-13: qty 50

## 2016-07-13 MED ORDER — SODIUM CHLORIDE 0.9% FLUSH: 9.0000 mL | INTRAVENOUS | Status: DC | PRN

## 2016-07-13 MED ORDER — FENTANYL CITRATE (PF) 250 MCG/5ML IJ SOLN
INTRAMUSCULAR | Status: AC
  Filled 2016-07-13: qty 5

## 2016-07-13 MED ORDER — PROPOFOL 10 MG/ML IV BOLUS
INTRAVENOUS | Status: DC | PRN
  Administered 2016-07-13: 180 mg via INTRAVENOUS

## 2016-07-13 MED ORDER — LIDOCAINE HCL (CARDIAC) 20 MG/ML IV SOLN
INTRAVENOUS | Status: AC
  Filled 2016-07-13: qty 5

## 2016-07-13 MED ORDER — KETOROLAC TROMETHAMINE 30 MG/ML IJ SOLN
INTRAMUSCULAR | Status: DC | PRN
  Administered 2016-07-13: 30 mg via INTRAVENOUS

## 2016-07-13 MED ORDER — MIDAZOLAM HCL 2 MG/2ML IJ SOLN
INTRAMUSCULAR | Status: DC | PRN
Start: 2016-07-13 — End: 2016-07-13
  Administered 2016-07-13: 1 mg via INTRAVENOUS

## 2016-07-13 MED ORDER — ROCURONIUM BROMIDE 100 MG/10ML IV SOLN
INTRAVENOUS | Status: AC
  Filled 2016-07-13: qty 1

## 2016-07-13 MED ORDER — METOCLOPRAMIDE HCL 5 MG/ML IJ SOLN: 10.0000 mg | Freq: Once | INTRAMUSCULAR | Status: DC | PRN

## 2016-07-13 MED ORDER — PHENYLEPHRINE 40 MCG/ML (10ML) SYRINGE FOR IV PUSH (FOR BLOOD PRESSURE SUPPORT)
PREFILLED_SYRINGE | INTRAVENOUS | Status: AC
  Filled 2016-07-13: qty 10

## 2016-07-13 MED ORDER — FENTANYL CITRATE (PF) 100 MCG/2ML IJ SOLN
INTRAMUSCULAR | Status: AC
  Filled 2016-07-13: qty 2

## 2016-07-13 MED ORDER — MEPERIDINE HCL 25 MG/ML IJ SOLN: 6.2500 mg | INTRAMUSCULAR | Status: DC | PRN

## 2016-07-13 MED ORDER — DEXAMETHASONE SODIUM PHOSPHATE 10 MG/ML IJ SOLN
INTRAMUSCULAR | Status: DC | PRN
  Administered 2016-07-13: 4 mg via INTRAVENOUS

## 2016-07-13 MED ORDER — LATANOPROST 0.005 % OP SOLN
1.0000 [drp] | Freq: Every day | OPHTHALMIC | Status: DC
  Filled 2016-07-13: qty 2.5

## 2016-07-13 MED ORDER — VASOPRESSIN 20 UNIT/ML IV SOLN
INTRAVENOUS | Status: AC
  Filled 2016-07-13: qty 1

## 2016-07-13 SURGICAL SUPPLY — 37 items
BENZOIN TINCTURE PRP APPL 2/3 (GAUZE/BANDAGES/DRESSINGS) ×3 IMPLANT
CANISTER SUCT 3000ML PPV (MISCELLANEOUS) ×3 IMPLANT
CLOTH BEACON ORANGE TIMEOUT ST (SAFETY) ×3 IMPLANT
CONT PATH 16OZ SNAP LID 3702 (MISCELLANEOUS) ×3 IMPLANT
DECANTER SPIKE VIAL GLASS SM (MISCELLANEOUS) ×3 IMPLANT
DRAPE CESAREAN BIRTH W POUCH (DRAPES) ×3 IMPLANT
DRAPE WARM FLUID 44X44 (DRAPE) ×3 IMPLANT
DRSG OPSITE POSTOP 4X10 (GAUZE/BANDAGES/DRESSINGS) ×3 IMPLANT
DURAPREP 26ML APPLICATOR (WOUND CARE) ×3 IMPLANT
ELECT BLADE 6.5 EXT (BLADE) ×3 IMPLANT
GAUZE SPONGE 4X4 16PLY XRAY LF (GAUZE/BANDAGES/DRESSINGS) ×3 IMPLANT
GLOVE BIO SURGEON STRL SZ7.5 (GLOVE) ×3 IMPLANT
GLOVE BIOGEL PI IND STRL 7.0 (GLOVE) ×4 IMPLANT
GLOVE BIOGEL PI IND STRL 8 (GLOVE) ×2 IMPLANT
GLOVE BIOGEL PI INDICATOR 7.0 (GLOVE) ×2
GLOVE BIOGEL PI INDICATOR 8 (GLOVE) ×1
GLOVE ECLIPSE 7.5 STRL STRAW (GLOVE) ×3 IMPLANT
GOWN STRL REUS W/TWL LRG LVL3 (GOWN DISPOSABLE) ×9 IMPLANT
HEMOSTAT ARISTA ABSORB 3G PWDR (MISCELLANEOUS) IMPLANT
NEEDLE HYPO 22GX1.5 SAFETY (NEEDLE) ×6 IMPLANT
NS IRRIG 1000ML POUR BTL (IV SOLUTION) ×3 IMPLANT
PACK ABDOMINAL GYN (CUSTOM PROCEDURE TRAY) ×3 IMPLANT
PAD OB MATERNITY 4.3X12.25 (PERSONAL CARE ITEMS) ×3 IMPLANT
PROTECTOR NERVE ULNAR (MISCELLANEOUS) ×3 IMPLANT
RETAINER VISCERAL (MISCELLANEOUS) ×3 IMPLANT
SPONGE LAP 18X18 X RAY DECT (DISPOSABLE) ×6 IMPLANT
STRIP CLOSURE SKIN 1/2X4 (GAUZE/BANDAGES/DRESSINGS) ×3 IMPLANT
SUT PLAIN 2 0 XLH (SUTURE) ×3 IMPLANT
SUT VIC AB 0 CT1 18XCR BRD8 (SUTURE) ×6 IMPLANT
SUT VIC AB 0 CT1 27 (SUTURE) ×6
SUT VIC AB 0 CT1 27XBRD ANBCTR (SUTURE) ×12 IMPLANT
SUT VIC AB 0 CT1 8-18 (SUTURE) ×3
SUT VIC AB 4-0 KS 27 (SUTURE) ×3 IMPLANT
SUT VICRYL 0 TIES 12 18 (SUTURE) ×3 IMPLANT
SYR CONTROL 10ML LL (SYRINGE) ×6 IMPLANT
TOWEL OR 17X24 6PK STRL BLUE (TOWEL DISPOSABLE) ×6 IMPLANT
TRAY FOLEY CATH SILVER 14FR (SET/KITS/TRAYS/PACK) ×3 IMPLANT

## 2016-07-13 NOTE — H&P (Addendum)
The patient was examined.  I reviewed the proposed surgery and consent form with the patient.  The dictated history and physical is current and accurate and all questions were answered. The patient is ready to proceed with surgery and has a realistic understanding and expectation for the outcome. Addendum:  Patient signed original permit for TAH Salpingectomies.  Confirmed with patient she DOES want her ovaries removed and she signed adended permit.   Anastasio Auerbach MD, 7:01 AM 07/13/2016

## 2016-07-13 NOTE — Progress Notes (Signed)
Dr. Phineas Real notified of patient's increased blood pressures and partially saturated honeycomb dressing. MD to come see patient this evening. No new orders at this time.

## 2016-07-13 NOTE — OR Nursing (Signed)
SURGICEL POWDER/REF# [id number], 3 GRAMS,  USED AT THE END OF SURGERY FOR HEMOSTASIS AND PROVIDED BY [facility] REP.

## 2016-07-13 NOTE — Transfer of Care (Signed)
Immediate Anesthesia Transfer of Care Note  Patient: Katelyn Salinas  Procedure(s) Performed: Procedure(s) with comments: HYSTERECTOMY ABDOMINAL (N/A) - Request 7:30am OR time  Request 2 hours OR time  Myrtie Neither will be here for the open vessel sealer SALPINGO OOPHORECTOMY (Bilateral)  Patient Location: PACU  Anesthesia Type:General  Level of Consciousness: awake, oriented, sedated and patient cooperative  Airway & Oxygen Therapy: Patient Spontanous Breathing and Patient connected to nasal cannula oxygen  Post-op Assessment: Report given to RN and Post -op Vital signs reviewed and stable  Post vital signs: Reviewed and stable  Last Vitals:  Vitals:   07/13/16 0630  BP: 126/65  Pulse: 84  Resp: 18  Temp: 36.9 C    Last Pain:  Vitals:   07/13/16 0630  TempSrc: Oral      Patients Stated Pain Goal: 3 (11/13/80 8833)  Complications: No apparent anesthesia complications

## 2016-07-13 NOTE — Op Note (Signed)
Katelyn Salinas Jun 24, 1960 [id number]   Post Operative Note   Date of surgery:  07/13/2016  Pre Op Dx:  Irregular bleeding, leiomyomata, anemia secondary to blood loss  Post Op Dx:  Irregular bleeding, leiomyoma, anemia secondary to blood loss, pelvic adhesions  Procedure:  Total abdominal hysterectomy, bilateral salpingo-oophorectomy, lysis of adhesions  Surgeon:  Anastasio Auerbach  Assistant:  Uvaldo Rising  Anesthesia:  General  EBL:  225 cc anesthesia reported  Complications:  None  Specimen:  Uterus, fallopian tubes and ovaries bilaterally to pathology. Clinical weight 273 g  Findings: EUA: Abdomen soft nontender without gross masses. Uterus enlarged approximately 12-14 week size   Operative:  Anterior cul-de-sac normal. Posterior cul-de-sac normal. Uterus grossly enlarged with multiple myomas ranging from subserosal to intramural.  Right and left ovaries small consistent with menopausal changes. Right and left fallopian tubes grossly normal with evidence of prior Falope ring tubal sterilization. Left adnexa with scarring involving the sigmoid colon to the left fallopian tube sharply lysed. Broad ligament thickened with shortening of the round ligament and broad ligament drawing it into the pelvic sidewall. No gross evidence of endometriosis. Upper abdominal exam shows liver palpates smooth with no abnormalities. No palpable upper adhesions or other pathology.  Procedure:  The patient was taken to the operating room, placed in the supine position, underwent general anesthesia and received an abdominal, perineal and vaginal preparation with antiseptic by nursing personnel with an indwelling Foley catheter placed in sterile technique. The timeout was performed by the surgical team. The patient was draped in the usual fashion. The abdomen was entered through a Pfannenstiel incision achieving adequate hemostasis at all levels. An O'Connor-O'Sullivan retractor was placed and the  intestines were packed from the operative field. Left colonic to adnexal adhesions were lysed sharply without difficulty and the uterus was elevated from the pelvis noting retractions on the left side inhibiting complete elevation. The right round ligament was transected using electrocautery and the vesicouterine peritoneal fold anteriorly was sharply incised to the midline. The right infundibulopelvic ligament and vessels were identified, the ureter identified away from the surgical site and the pedicle was doubly clamped cut and doubly ligated using 0 Vicryl suture in a simple stitch followed by a suture ligature. The posterior peritoneal reflection was incised to the level of the uterus and the uterine vessels were skeletonized without difficulty. The uterine vessels were then clamped cut and ligated using 0 Vicryl suture at the level of the upper portion of the lower uterine segment. Due to the restriction of the adhesions on the left the uterine ovarian pedicle and parametrial tissues were doubly clamped cut and ligated incorporating the round ligament to free up the uterus and this was subsequently ligated using 0 Vicryl suture. The left uterine vessels were then skeletonized clamped cut and ligated using 0 Vicryl suture in the upper portion of the lower uterine segment. Due to the bulk of the uterus restricting pelvic visualization the myometrium overlying the myomas were injected using vasopressin in a 20 unit per 50 cc dilution and multiple myomectomies were performed to collapse the uterus and subsequently a supracervical hysterectomy was performed to allow better visualization. The cervical stump was then progressively freed from it's attachments through clamping cutting and ligating of the paracervical tissues using 0 Vicryl suture. The vesicouterine plane was progressively developed through sharp and blunt dissection and ultimately the upper vagina was clamped, cut and the vagina entered and the cervix  was circumferentially excised without difficulty. The right and left vaginal  angle sutures were then placed using 0 Vicryl suture and tagged for future reference. The vagina was then closed anterior to posterior using 0 Vicryl suture in interrupted figure-of-eight stitch. Copious irrigation showed several small bleeding points which were addressed with electrocautery and the pelvis was packed and attention turned to the left adnexa. The ovary and fallopian tube segment were elevated and through blunt dissection the ureter identified in the left sidewall and subsequently the infundibulopelvic ligament vessels and remaining attached tissues were clamped cut and ligated using 0 Vicryl suture and the ovary and fallopian tube segment removed and sent to pathology. The pelvis was copiously irrigated showing adequate hemostasis and Surgicel powder was placed at the cuff prophylactically for continued hemostasis. The bowel packing and retractor were removed, the sponge count was verified correct and the subcutaneous and fascial layers were circumferentially injected using Exparel. The fascia was then reapproximated starting at the angle meeting in the middle using 0 Vicryl suture in a running stitch, the subcutaneous tissues irrigated with ultimate hemostasis achieved with electrocautery. The subcutaneous tissues were reapproximated using 3-0 plain suture in interrupted subcutaneous stitch and the skin was reapproximated using 4-0 Vicryl and a running subcuticular stitch. Steri-Strips and benzoin is applied as was a sterile dressing. The patient received intraoperative Toradol. The sponge and needle counts were again verified correct and the patient was awakened without difficulty and taken to recovery room in good condition having tolerated the procedure well.     Anastasio Auerbach MD, 11:03 AM 07/13/2016

## 2016-07-13 NOTE — Anesthesia Procedure Notes (Signed)
Procedure Name: Intubation Date/Time: 07/13/2016 7:21 AM Performed by: Tobin Chad Pre-anesthesia Checklist: Patient identified, Emergency Drugs available, Suction available, Patient being monitored and Timeout performed Oxygen Delivery Method: Circle system utilized and Simple face mask Preoxygenation: Pre-oxygenation with 100% oxygen Intubation Type: IV induction and Inhalational induction Ventilation: Mask ventilation without difficulty Laryngoscope Size: Mac and 3 Grade View: Grade II Tube type: Oral Tube size: 7.0 mm Airway Equipment and Method: Stylet Placement Confirmation: ETT inserted through vocal cords under direct vision,  positive ETCO2 and breath sounds checked- equal and bilateral Secured at: 21 (com) cm Dental Injury: Teeth and Oropharynx as per pre-operative assessment

## 2016-07-13 NOTE — Progress Notes (Signed)
Patient ID: Katelyn Salinas, female   DOB: Oct 01, 1960, 56 y.o.   MRN: [id number] Katelyn Salinas Jun 28, 1960 [id number]   Day of Surgery s/p Procedure(s): HYSTERECTOMY ABDOMINAL SALPINGO OOPHORECTOMY  Subjective: Patient reports no acute distress, pain severity reported moderate, Yes.  taking PO, foley catheter in place with clear yellow urineYes.  ambulating, No. passing flatus  Objective: Vital signs in last 24 hours: Temp:  [97.9 F (36.6 C)-99 F (37.2 C)] 98.2 F (36.8 C) (03/20 1534) Pulse Rate:  [73-91] 80 (03/20 1645) Resp:  [10-19] 16 (03/20 1534) BP: (126-170)/(65-88) 155/80 (03/20 1645) SpO2:  [96 %-100 %] 100 % (03/20 1534)    EXAM General: awake, alert and mild distress Resp: clear to auscultation bilaterally Cardio: regular rate and rhythm GI: soft, minimal tenderness, bowel sounds absent. Dressing blood stained.  Removed with incision intact. Dressing replaced and pressure guaze applied over Lower Extremities: Without swelling or tenderness Vaginal Bleeding: reported scant  Assessment: s/p Procedure(s): HYSTERECTOMY ABDOMINAL SALPINGO OOPHORECTOMY: stable.  BPs running 150-160/70-80.  Received BP meds this AM.  Monitor tonight with call parameters given.  Plan: Continue routine post operative care, Advance diet Encourage ambulation, foley out with ambulation.  Results of surgery reviewed with patient and husband  LOS: 0 days    Anastasio Auerbach MD, 5:12 PM 07/13/2016

## 2016-07-13 NOTE — Anesthesia Preprocedure Evaluation (Signed)
Anesthesia Evaluation  Patient identified by MRN, date of birth, ID band Patient awake    Reviewed: Allergy & Precautions, NPO status , Patient's Chart, lab work & pertinent test results  Airway Mallampati: I  TM Distance: >3 FB Neck ROM: Full    Dental no notable dental hx.    Pulmonary neg pulmonary ROS,    Pulmonary exam normal breath sounds clear to auscultation       Cardiovascular hypertension, Pt. on medications Normal cardiovascular exam Rhythm:Regular Rate:Normal     Neuro/Psych negative neurological ROS  negative psych ROS   GI/Hepatic negative GI ROS, Neg liver ROS,   Endo/Other  negative endocrine ROS  Renal/GU negative Renal ROS  negative genitourinary   Musculoskeletal negative musculoskeletal ROS (+)   Abdominal   Peds negative pediatric ROS (+)  Hematology  (+) anemia ,   Anesthesia Other Findings   Reproductive/Obstetrics negative OB ROS                             Anesthesia Physical Anesthesia Plan  ASA: II  Anesthesia Plan: General   Post-op Pain Management:    Induction: Intravenous  Airway Management Planned: Oral ETT  Additional Equipment:   Intra-op Plan:   Post-operative Plan: Extubation in OR  Informed Consent: I have reviewed the patients History and Physical, chart, labs and discussed the procedure including the risks, benefits and alternatives for the proposed anesthesia with the patient or authorized representative who has indicated his/her understanding and acceptance.   Dental advisory given  Plan Discussed with: CRNA  Anesthesia Plan Comments:         Anesthesia Quick Evaluation

## 2016-07-14 ENCOUNTER — Encounter (HOSPITAL_COMMUNITY): Payer: Self-pay | Admitting: Gynecology

## 2016-07-14 DIAGNOSIS — D259 Leiomyoma of uterus, unspecified: Secondary | ICD-10-CM | POA: Diagnosis not present

## 2016-07-14 LAB — CBC
HEMATOCRIT: 23.3 % — AB (ref 36.0–46.0)
HEMOGLOBIN: 7.5 g/dL — AB (ref 12.0–15.0)
MCH: 28 pg (ref 26.0–34.0)
MCHC: 32.2 g/dL (ref 30.0–36.0)
MCV: 86.9 fL (ref 78.0–100.0)
Platelets: 221 10*3/uL (ref 150–400)
RBC: 2.68 MIL/uL — ABNORMAL LOW (ref 3.87–5.11)
RDW: 15.7 % — ABNORMAL HIGH (ref 11.5–15.5)
WBC: 6.9 10*3/uL (ref 4.0–10.5)

## 2016-07-14 MED ORDER — OXYCODONE-ACETAMINOPHEN 5-325 MG PO TABS
2.0000 | ORAL_TABLET | ORAL | Status: DC | PRN
  Administered 2016-07-14: 2 via ORAL
  Filled 2016-07-14: qty 2

## 2016-07-14 MED ORDER — OXYCODONE-ACETAMINOPHEN 5-325 MG PO TABS
2.0000 | ORAL_TABLET | ORAL | 0 refills | Status: DC | PRN
Start: 2016-07-14 — End: 2016-08-26

## 2016-07-14 MED ORDER — PANTOPRAZOLE SODIUM 40 MG PO TBEC
40.0000 mg | DELAYED_RELEASE_TABLET | Freq: Every day | ORAL | Status: DC
  Administered 2016-07-14: 40 mg via ORAL
  Filled 2016-07-14: qty 1

## 2016-07-14 NOTE — Progress Notes (Signed)

## 2016-07-14 NOTE — Discharge Instructions (Signed)
°  Postoperative Instructions Hysterectomy ° °Dr. Tyshon Fanning and the nursing staff have discussed postoperative instructions with you.  If you have any questions please ask them before you leave the hospital, or call Dr Cola Gane’s office at [phone].   ° °We would like to emphasize the following instructions: ° ° °  Call the office to make your follow-up appointment as recommended by Dr Falana Clagg (usually 2 weeks). ° °  You were given a prescription, or one was ordered for you at the pharmacy you designated.  Get that prescription filled and take the medication according to instructions. ° °  You may eat a regular diet, but slowly until you start having bowel movements. ° °  Drink plenty of water daily. ° °  Nothing in the vagina (intercourse, douching, objects of any kind) until released by Dr Hilarie Sinha. ° °  No driving for two weeks.  Wait to be cleared by Dr Diamantina Edinger at your first post op check.  Car rides (short) are ok after several days at home, as long as you are not having significant pain, but no traveling out of town. ° °  You may shower, but no baths.  Walking up and down stairs is ok.  No heavy lifting, prolonged standing, repeated bending or any “working out” until your first post op check. ° °  Rest frequently, listen to your body and do not push yourself and overdo it. ° °  Call if: ° °o Your pain medication does not seem strong enough. °o Worsening pain or abdominal bloating °o Persistent nausea or vomiting °o Difficulty with urination or bowel movements. °o Temperature of 101 degrees or higher. °o Bleeding heavier then staining (clots or period type flow). °o Incisions become red, tender or begin to drain. °o You have any questions or concerns. °

## 2016-07-14 NOTE — Progress Notes (Signed)
Doing well and requests discharge.  Ambulating, voiding, passing flatus, good pain relief on PO med's.  Pressure dressing removed with no bleeding.  Precautions, instructions and follow up reviewed.  Follow up in 2 weeks in office.

## 2016-07-14 NOTE — Progress Notes (Signed)
Katelyn Salinas Jun 13, 1960 [id number]   1 Day Post-Op s/p Procedure(s): HYSTERECTOMY ABDOMINAL SALPINGO OOPHORECTOMY  Subjective: Patient reports feels well, no acute distress, pain severity reported mild, Yes.   taking PO, foley catheter out, No. voiding, Yes.   ambulating, Yes.   passing flatus  Objective: Vital signs in last 24 hours: Temp:  [97.9 F (36.6 C)-99 F (37.2 C)] 98.1 F (36.7 C) (03/21 0800) Pulse Rate:  [73-91] 77 (03/21 0800) Resp:  [10-19] 16 (03/21 0800) BP: (103-170)/(54-88) 121/54 (03/21 0800) SpO2:  [96 %-100 %] 100 % (03/21 0800)    EXAM General: awake, alert and no distress Resp: clear to auscultation bilaterally Cardio: regular rate and rhythm GI: soft, minimal tenderness, bowel sounds active, dressing dry Lower Extremities: Without swelling or tenderness Vaginal Bleeding: reported scant  Lab Results:   Recent Labs  07/14/16 0548  WBC 6.9  HGB 7.5*  HCT 23.3*  PLT 221    Assessment: s/p Procedure(s): HYSTERECTOMY ABDOMINAL SALPINGO OOPHORECTOMY: stable and progressing well.  Hb 7.5 with preop 9.7.  Clinically stable with ambulation.  AM BP 121/54  Plan: Continue routine post operative care, switch to PO pain meds, await voiding, T/C discharge later today if continues well  LOS: 0 days    Anastasio Auerbach MD, 8:36 AM 07/14/2016

## 2016-07-14 NOTE — Anesthesia Postprocedure Evaluation (Signed)
Anesthesia Post Note  Patient: Katelyn Salinas  Procedure(s) Performed: Procedure(s) (LRB): HYSTERECTOMY ABDOMINAL (N/A) SALPINGO OOPHORECTOMY (Bilateral)  Patient location during evaluation: PACU Anesthesia Type: General Level of consciousness: awake and alert Pain management: pain level controlled Vital Signs Assessment: post-procedure vital signs reviewed and stable Respiratory status: spontaneous breathing, nonlabored ventilation, respiratory function stable and patient connected to nasal cannula oxygen Cardiovascular status: blood pressure returned to baseline and stable Postop Assessment: no signs of nausea or vomiting Anesthetic complications: no       Last Vitals:  Vitals:   07/14/16 0551 07/14/16 0800  BP:  (!) 121/54  Pulse:  77  Resp: 12 16  Temp:  36.7 C    Last Pain:  Vitals:   07/14/16 1020  TempSrc:   PainSc: 2                  Montez Hageman

## 2016-07-28 ENCOUNTER — Other Ambulatory Visit: Payer: Self-pay | Admitting: Gynecology

## 2016-07-30 ENCOUNTER — Ambulatory Visit (INDEPENDENT_AMBULATORY_CARE_PROVIDER_SITE_OTHER): Payer: 59 | Admitting: Gynecology

## 2016-07-30 ENCOUNTER — Encounter: Payer: Self-pay | Admitting: Gynecology

## 2016-07-30 VITALS — BP 130/80

## 2016-07-30 DIAGNOSIS — Z9889 Other specified postprocedural states: Secondary | ICD-10-CM

## 2016-07-30 MED ORDER — ESTRADIOL 1 MG PO TABS: 1.0000 mg | ORAL_TABLET | Freq: Every day | ORAL | 2 refills | Status: DC

## 2016-07-30 NOTE — Progress Notes (Signed)
    Katelyn Salinas 1960-10-31 [id number]        56 y.o.  G1P1001 presents for her 2 week postoperative visit status post TAH/BSO. Doing well without complaints.  Past medical history,surgical history, problem list, medications, allergies, family history and social history were all reviewed and documented in the EPIC chart.  Directed ROS with pertinent positives and negatives documented in the history of present illness/assessment and plan.  Exam: [name] assistant Vitals:   07/30/16 1229  BP: 130/80   General appearance:  Normal Abdomen soft with minimal tenderness. Incision healing nicely. Pelvic external BUS vagina with cuff intact. By manual without masses or significant tenderness  Assessment/Plan:  56 y.o. G1P1001 status post TAH/BSO 2 weeks ago. Doing well. Reviewed pathology which showed endometrial polyp, leiomyoma and adenomyosis. Is on estradiol 1 mg doing well with this and will continue. I refilled her 3 months. Patient will follow up in 2 weeks for her next postoperative visit. Will slowly resume activities with the exception of continued pelvic rest.    Anastasio Auerbach MD, 12:46 PM 07/30/2016

## 2016-07-30 NOTE — Patient Instructions (Signed)
Follow up in 2 weeks for next postoperative visit

## 2016-08-13 ENCOUNTER — Encounter: Payer: Self-pay | Admitting: Gynecology

## 2016-08-13 ENCOUNTER — Ambulatory Visit (INDEPENDENT_AMBULATORY_CARE_PROVIDER_SITE_OTHER): Payer: 59 | Admitting: Gynecology

## 2016-08-13 VITALS — BP 112/70

## 2016-08-13 DIAGNOSIS — Z9889 Other specified postprocedural states: Secondary | ICD-10-CM

## 2016-08-13 DIAGNOSIS — N3000 Acute cystitis without hematuria: Secondary | ICD-10-CM

## 2016-08-13 LAB — URINALYSIS W MICROSCOPIC + REFLEX CULTURE
BILIRUBIN URINE: NEGATIVE
CASTS: NONE SEEN [LPF]
CRYSTALS: NONE SEEN [HPF]
GLUCOSE, UA: NEGATIVE
KETONES UR: NEGATIVE
Nitrite: NEGATIVE
PROTEIN: NEGATIVE
SPECIFIC GRAVITY, URINE: 1.02 (ref 1.001–1.035)
Yeast: NONE SEEN [HPF]
pH: 5.5 (ref 5.0–8.0)

## 2016-08-13 MED ORDER — CIPROFLOXACIN HCL 250 MG PO TABS: 250.0000 mg | ORAL_TABLET | Freq: Two times a day (BID) | ORAL | 0 refills | Status: DC

## 2016-08-13 NOTE — Addendum Note (Signed)
Addended by: Nelva Nay on: 08/13/2016 03:06 PM   Modules accepted: Orders

## 2016-08-13 NOTE — Progress Notes (Signed)
    Katelyn Salinas [birth year] [id number]        56 y.o.  G1P100 presents for her 4 week postoperative visit status post TAH/BSO. Also complaining of some dysuria. No frequency urgency low back pain fever or chills. No nausea vomiting diarrhea constipation.  Past medical history,surgical history, problem list, medications, allergies, family history and social history were all reviewed and documented in the EPIC chart.  Directed ROS with pertinent positives and negatives documented in the history of present illness/assessment and plan.  Exam: [name] assistant Vitals:   08/13/16 1411  BP: 112/70   General appearance:  Normal Abdomen soft with minimal tenderness. Incision well-healed Pelvic external BUS vagina with cuff well healed. Bimanual without gross masses or tenderness.  Assessment/Plan:  56 y.o. G1P1001 with normal postoperative exam. Urinalysis does suggest UTI. Will cover with ciprofloxacin 250 mg twice a day 7 days. Follow up if symptoms persist, worsen or recur. Follow up in 2 weeks for next postoperative visit.    Anastasio Auerbach MD, 2:28 PM 08/13/2016

## 2016-08-13 NOTE — Patient Instructions (Signed)
Take the antibiotic twice daily for 7 days.  Follow up for your next postoperative visit in 2 weeks

## 2016-08-15 LAB — URINE CULTURE

## 2016-08-26 ENCOUNTER — Ambulatory Visit (INDEPENDENT_AMBULATORY_CARE_PROVIDER_SITE_OTHER): Payer: 59 | Admitting: Gynecology

## 2016-08-26 ENCOUNTER — Encounter: Payer: Self-pay | Admitting: Gynecology

## 2016-08-26 VITALS — BP 122/78

## 2016-08-26 DIAGNOSIS — Z09 Encounter for follow-up examination after completed treatment for conditions other than malignant neoplasm: Secondary | ICD-10-CM

## 2016-08-26 NOTE — Patient Instructions (Addendum)
Follow up for annual exam end of summer

## 2016-08-26 NOTE — Progress Notes (Signed)
    Katelyn Salinas 1960-05-11 [id number]        56 y.o.  G1P1001 presents for her postoperative visit status post TAH/BSO. Doing well.  Past medical history,surgical history, problem list, medications, allergies, family history and social history were all reviewed and documented in the EPIC chart.  Directed ROS with pertinent positives and negatives documented in the history of present illness/assessment and plan.  Exam: [name] assistant Vitals:   08/26/16 1054  BP: 122/78   General appearance:  Normal Abdomen soft nontender without masses guarding rebound. Incision healed nicely. Pelvic external BUS vagina with cuff well healed. Bimanual without masses or tenderness.  Assessment/Plan:  56 y.o. G1P1001 with normal postoperative checkup. We'll slowly resume all normal activities. Will continue pelvic rest for another 2 weeks. Will follow up end of summer for annual exam as she is overdue. Sooner if any issues.    Anastasio Auerbach MD, 11:11 AM 08/26/2016

## 2016-08-27 ENCOUNTER — Telehealth: Payer: Self-pay

## 2016-08-27 NOTE — Telephone Encounter (Signed)
Patient called stating she needs note to return to wrk 09/08/16. Letter mailed to patient at her request.

## 2016-09-08 ENCOUNTER — Encounter: Payer: Self-pay | Admitting: Gynecology

## 2016-11-17 ENCOUNTER — Other Ambulatory Visit: Payer: Self-pay | Admitting: Gynecology

## 2016-12-15 ENCOUNTER — Other Ambulatory Visit: Payer: Self-pay | Admitting: Gynecology

## 2016-12-15 NOTE — Telephone Encounter (Signed)
You give pt 3 refills in April, OV states pt due "end of summer for annual"

## 2016-12-15 NOTE — Telephone Encounter (Signed)
She needs to schedule an annual exam appointment. Okay to refill 2 months to allow her to make this appointment

## 2017-02-13 ENCOUNTER — Other Ambulatory Visit: Payer: Self-pay | Admitting: Gynecology

## 2017-05-24 ENCOUNTER — Other Ambulatory Visit: Payer: Self-pay | Admitting: *Deleted

## 2017-06-06 ENCOUNTER — Other Ambulatory Visit: Payer: Self-pay

## 2017-06-06 NOTE — Telephone Encounter (Signed)
Patient needs annual exam before any further refills

## 2017-06-07 NOTE — Telephone Encounter (Signed)
Prescription refill denied and Dr. Dorette Grate note was placed on reason line.

## 2017-08-03 ENCOUNTER — Other Ambulatory Visit: Payer: Self-pay | Admitting: Gynecology

## 2017-10-03 ENCOUNTER — Encounter: Payer: 59 | Admitting: Gynecology

## 2017-10-24 DIAGNOSIS — A609 Anogenital herpesviral infection, unspecified: Secondary | ICD-10-CM

## 2017-10-24 HISTORY — DX: Anogenital herpesviral infection, unspecified: A60.9

## 2017-11-03 ENCOUNTER — Other Ambulatory Visit: Payer: Self-pay | Admitting: Gynecology

## 2017-11-03 DIAGNOSIS — Z1231 Encounter for screening mammogram for malignant neoplasm of breast: Secondary | ICD-10-CM

## 2017-11-16 ENCOUNTER — Encounter: Payer: Self-pay | Admitting: Gynecology

## 2017-11-16 ENCOUNTER — Ambulatory Visit (INDEPENDENT_AMBULATORY_CARE_PROVIDER_SITE_OTHER): Payer: 59 | Admitting: Gynecology

## 2017-11-16 VITALS — BP 130/86 | Ht 65.5 in | Wt 185.0 lb

## 2017-11-16 DIAGNOSIS — Z01419 Encounter for gynecological examination (general) (routine) without abnormal findings: Secondary | ICD-10-CM | POA: Diagnosis not present

## 2017-11-16 DIAGNOSIS — N766 Ulceration of vulva: Secondary | ICD-10-CM | POA: Diagnosis not present

## 2017-11-16 DIAGNOSIS — Z79890 Hormone replacement therapy (postmenopausal): Secondary | ICD-10-CM | POA: Diagnosis not present

## 2017-11-16 MED ORDER — VALACYCLOVIR HCL 500 MG PO TABS: 500.0000 mg | ORAL_TABLET | Freq: Two times a day (BID) | ORAL | 1 refills | Status: DC

## 2017-11-16 MED ORDER — ESTRADIOL 1 MG PO TABS: 1.0000 mg | ORAL_TABLET | Freq: Every day | ORAL | 4 refills | Status: DC

## 2017-11-16 NOTE — Progress Notes (Signed)
Katelyn Salinas 06-22-1960 [id number]        57 y.o.  G1P1001 for annual gynecologic exam.  Complaining of whitish material coming from the periclitoral region.  She notices it when she squeezes around this area.  Nonpainful or inflammatory.  Status post TAH/BSO this past year currently on estradiol 1 mg daily.  Past medical history,surgical history, problem list, medications, allergies, family history and social history were all reviewed and documented as reviewed in the EPIC chart.  ROS:  Performed with pertinent positives and negatives included in the history, assessment and plan.   Additional significant findings : None   Exam: [name] assistant Vitals:   11/16/17 1611  BP: 130/86  Weight: 185 lb (83.9 kg)  Height: 5' 5.5" (1.664 m)   Body mass index is 30.32 kg/m.  General appearance:  Normal affect, orientation and appearance. Skin: Grossly normal HEENT: Without gross lesions.  No cervical or supraclavicular adenopathy. Thyroid normal.  Lungs:  Clear without wheezing, rales or rhonchi Cardiac: RR, without RMG Abdominal:  Soft, nontender, without masses, guarding, rebound, organomegaly or hernia Breasts:  Examined lying and sitting without masses, retractions, discharge or axillary adenopathy. Pelvic:  Ext, BUS, Vagina: With whitish sebaceous type discharge from the periclitoral hood consistent with squamous cell debris.  Completely cleaned out from the area with a Q-tip.  Small cluster of classic appearing herpetic type ulcers left lower labia majora around the posterior fourchette.  Adnexa: Without masses or tenderness    Anus and perineum: Normal   Rectovaginal: Normal sphincter tone without palpated masses or tenderness.    Assessment/Plan:  57 y.o. G83P1001 female for annual gynecologic exam status post TAH/BSO for leiomyoma and irregular bleeding.   1. Postmenopausal/HRT.  Continues on estradiol 1 mg daily.  Doing well.  Initiated for menopausal symptoms.   Risks versus benefits reviewed to include the risks of thrombosis such as stroke heart attack DVT in the breast cancer issue.  Benefits to include symptom relief as well as possible cardiovascular and bone health also discussed.  At this point will continue for now and I refilled her x1 year. 2. Whitish discharge in the periclitoral region noted by the patient.  I think this is accumulated squamous debris.  We discussed about more vigorous cleaning of this area routinely with retraction of the clitoral hood which I think will alleviate this issue. 3. 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  She relates historically that we have tried to culture this in the past and was negative.  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  I attempted PCR checked today although I did examine her before doing this with K-Y jelly in the area which may give it a false negative.  I did recommend Valtrex 500 mg twice daily at the earliest onset of any outbreaks x5 days to see if we can suppress lengths of outbreak.  #30 with 1 refill provided. 4. Pap smear/HPV 01/2014.  No Pap smear done today.  No history of significant abnormal Pap smears.  Current screening guidelines reviewed and options to stop screening due to hysterectomy history versus less frequent screening intervals reviewed.  Will readdress on an annual basis. 5. Mammography scheduled and patient will follow-up for this.  Breast exam normal today. 6. DEXA 2011 normal.  Recommend follow-up DEXA at age 43. 56. Colonoscopy 2017.  Repeat at their recommended interval. 8. Health maintenance.  No routine lab work done as patient does this elsewhere.  Follow-up 1 year, sooner as needed.   Anastasio Auerbach MD, 4:46 PM 11/16/2017

## 2017-11-16 NOTE — Addendum Note (Signed)
Addended by: Nelva Nay on: 11/16/2017 04:57 PM   Modules accepted: Orders

## 2017-11-16 NOTE — Patient Instructions (Addendum)
Take the Valtrex medication twice daily during outbreaks around the vagina to help decrease the length of outbreak.  Do this for 5 days.  Office will call with the viral culture results when available.

## 2017-11-21 LAB — SURESWAB HSV, TYPE 1/2 DNA, PCR
HSV 1 DNA: NOT DETECTED
HSV 2 DNA: DETECTED — AB

## 2017-11-22 ENCOUNTER — Encounter: Payer: Self-pay | Admitting: Gynecology

## 2017-11-24 ENCOUNTER — Ambulatory Visit
Admission: RE | Admit: 2017-11-24 | Discharge: 2017-11-24 | Disposition: A | Discharge status: Home / Self Care | Payer: 59 | Source: Ambulatory Visit | Attending: Gynecology | Admitting: Gynecology

## 2017-11-24 DIAGNOSIS — Z1231 Encounter for screening mammogram for malignant neoplasm of breast: Secondary | ICD-10-CM

## 2018-01-27 ENCOUNTER — Other Ambulatory Visit: Payer: Self-pay | Admitting: Gynecology

## 2018-07-21 ENCOUNTER — Other Ambulatory Visit: Payer: Self-pay | Admitting: Gynecology

## 2018-08-06 ENCOUNTER — Other Ambulatory Visit: Payer: Self-pay | Admitting: Gynecology

## 2018-08-14 ENCOUNTER — Other Ambulatory Visit: Payer: Self-pay | Admitting: Gynecology

## 2019-01-23 ENCOUNTER — Encounter: Payer: Self-pay | Admitting: Gynecology

## 2019-01-26 ENCOUNTER — Other Ambulatory Visit: Payer: Self-pay

## 2019-01-26 ENCOUNTER — Ambulatory Visit: Payer: 59 | Admitting: Gynecology

## 2019-01-26 ENCOUNTER — Other Ambulatory Visit: Payer: Self-pay | Admitting: Gynecology

## 2019-01-26 ENCOUNTER — Encounter: Payer: Self-pay | Admitting: Gynecology

## 2019-01-26 VITALS — BP 126/82

## 2019-01-26 DIAGNOSIS — N644 Mastodynia: Secondary | ICD-10-CM | POA: Diagnosis not present

## 2019-01-26 DIAGNOSIS — Z1231 Encounter for screening mammogram for malignant neoplasm of breast: Secondary | ICD-10-CM

## 2019-01-26 NOTE — Progress Notes (Signed)
    Katelyn Salinas 11/16/1960 [id number]        58 y.o.  G1P1001 presents complaining of several months of left breast tenderness.  On questioning she is having tenderness in both breasts although left breast seems to be more tender.  The tenderness comes and goes.  Particularly noticeable in the 11 to 12 o'clock position off the areola.  No nipple discharge for any abnormalities on self breast exam.  She called to schedule her screening mammogram and they deferred her to me.  Past medical history,surgical history, problem list, medications, allergies, family history and social history were all reviewed and documented in the EPIC chart.  Directed ROS with pertinent positives and negatives documented in the history of present illness/assessment and plan.  Exam: [name] assistant Vitals:   01/26/19 1538  BP: 126/82   General appearance:  Normal Both breasts examined lying and sitting without masses, retractions, discharge, adenopathy.  Assessment/Plan:  58 y.o. G1P1001 with bilateral breast tenderness left greater than right.  Most noticeable in the 11 to 12 o'clock position 1 to 2 fingerbreadths off the areola.  Physician exam is normal without palpable abnormalities.  We will schedule diagnostic bilateral mammography and ultrasound at the 11 to 12 o'clock position left breast.  Assuming the studies are negative the patient will continue with self breast exam and follow-up if she palpates any abnormalities or if the tenderness would persist to consider whether any further studies are necessary.    Anastasio Auerbach MD, 3:47 PM 01/26/2019

## 2019-01-26 NOTE — Patient Instructions (Signed)
The breast center will call to schedule the mammography and ultrasound.  If you do not hear from them within a week call my office.

## 2019-01-29 ENCOUNTER — Telehealth: Payer: Self-pay | Admitting: *Deleted

## 2019-01-29 DIAGNOSIS — N644 Mastodynia: Secondary | ICD-10-CM

## 2019-01-29 NOTE — Telephone Encounter (Signed)
Patient scheduled at breast on 02/01/19 9:30am, left message for patient to call.

## 2019-01-29 NOTE — Telephone Encounter (Signed)
-----   Message from Anastasio Auerbach, MD sent at 01/26/2019  3:49 PM EDT ----- Schedule bilateral diagnostic mammography and left breast ultrasound reference bilateral breast tenderness, left greater than right with point tenderness left breast at the 11 to 12 o'clock position 2 fingerbreadths off the areola.  No palpable abnormalities on physician exam

## 2019-01-29 NOTE — Telephone Encounter (Signed)
Patient informed. 

## 2019-02-01 ENCOUNTER — Ambulatory Visit: Payer: 59

## 2019-02-01 ENCOUNTER — Other Ambulatory Visit: Payer: Self-pay

## 2019-02-01 ENCOUNTER — Ambulatory Visit
Admission: RE | Admit: 2019-02-01 | Discharge: 2019-02-01 | Disposition: A | Discharge status: Home / Self Care | Payer: 59 | Source: Ambulatory Visit | Attending: Gynecology | Admitting: Gynecology

## 2019-02-01 DIAGNOSIS — N644 Mastodynia: Secondary | ICD-10-CM

## 2019-03-01 ENCOUNTER — Encounter: Payer: Self-pay | Admitting: Gynecology

## 2019-03-01 ENCOUNTER — Ambulatory Visit (INDEPENDENT_AMBULATORY_CARE_PROVIDER_SITE_OTHER): Payer: 59 | Admitting: Gynecology

## 2019-03-01 ENCOUNTER — Other Ambulatory Visit: Payer: Self-pay

## 2019-03-01 VITALS — BP 124/80 | Ht 65.0 in | Wt 185.0 lb

## 2019-03-01 DIAGNOSIS — B373 Candidiasis of vulva and vagina: Secondary | ICD-10-CM

## 2019-03-01 DIAGNOSIS — B3731 Acute candidiasis of vulva and vagina: Secondary | ICD-10-CM

## 2019-03-01 DIAGNOSIS — N898 Other specified noninflammatory disorders of vagina: Secondary | ICD-10-CM

## 2019-03-01 DIAGNOSIS — Z23 Encounter for immunization: Secondary | ICD-10-CM

## 2019-03-01 DIAGNOSIS — Z79890 Hormone replacement therapy (postmenopausal): Secondary | ICD-10-CM

## 2019-03-01 DIAGNOSIS — Z01419 Encounter for gynecological examination (general) (routine) without abnormal findings: Secondary | ICD-10-CM

## 2019-03-01 DIAGNOSIS — N952 Postmenopausal atrophic vaginitis: Secondary | ICD-10-CM | POA: Diagnosis not present

## 2019-03-01 LAB — WET PREP FOR TRICH, YEAST, CLUE

## 2019-03-01 MED ORDER — FLUCONAZOLE 150 MG PO TABS: 150.0000 mg | ORAL_TABLET | Freq: Every day | ORAL | 0 refills | Status: DC

## 2019-03-01 MED ORDER — ESTRADIOL 1 MG PO TABS: 1.0000 mg | ORAL_TABLET | Freq: Every day | ORAL | 4 refills | Status: DC

## 2019-03-01 MED ORDER — VALACYCLOVIR HCL 500 MG PO TABS: ORAL_TABLET | ORAL | 2 refills | Status: DC

## 2019-03-01 NOTE — Progress Notes (Signed)
    Katelyn Salinas 02/10/1961 [id number]        58 y.o.  G1P1001 for annual gynecologic exam.  Recently evaluated for mastalgia.  Diagnostic mammography was negative.  Notes symptoms a little better but still present.  Also complaining of a rash on her thighs and in her panniculus that she has been using OTC products for and which seems to be getting better.  Past medical history,surgical history, problem list, medications, allergies, family history and social history were all reviewed and documented as reviewed in the EPIC chart.  ROS:  Performed with pertinent positives and negatives included in the history, assessment and plan.   Additional significant findings : None   Exam: [name] assistant Vitals:   03/01/19 1445  BP: 124/80  Weight: 185 lb (83.9 kg)  Height: 5\' 5"  (1.651 m)   Body mass index is 30.79 kg/m.  General appearance:  Normal affect, orientation and appearance. Skin: Grossly normal HEENT: Without gross lesions.  No cervical or supraclavicular adenopathy. Thyroid normal.  Lungs:  Clear without wheezing, rales or rhonchi Cardiac: RR, without RMG Abdominal:  Soft, nontender, without masses, guarding, rebound, organomegaly or hernia Breasts:  Examined lying and sitting without masses, retractions, discharge or axillary adenopathy. Pelvic:  Ext, BUS, Vagina: With thick white cottage cheese discharge.  Pap smear of vaginal cuff done  Adnexa: Without masses or tenderness    Anus and perineum: Normal   Rectovaginal: Normal sphincter tone without palpated masses or tenderness.    Assessment/Plan:  58 y.o. G107P1001 female for annual gynecologic exam.  Status post TAH/BSO for leiomyoma and irregular bleeding  1. Rash history with thick white discharge.  Wet prep is positive for yeast.  Will treat with Diflucan 150 mg x 3 days given the skin involvement.  She will follow-up if the symptoms persist or recur. 2. Postmenopausal/HRT.  On estradiol 1 mg daily.  Doing  well with this.  We again discussed the risks versus benefits to include thrombosis such as stroke heart attack DVT in the breast cancer issue.  At this point the patient would like to continue and I refilled her x1 year. 3. Colonoscopy 2017.  Repeat at their recommended interval. 4. 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  Diagnostic mammogram 01/2019 -.  Exam normal.  We will continue to monitor for now.  Possible trial of transiently discontinuing estradiol discussed. 5. DEXA 2011 normal.  Plan repeat DEXA at age 66. 80. Pap smear/HPV 2015.  Pap smear done today.  No history of abnormal Pap smears.  Options to stop screening per current screening guidelines reviewed.  Will readdress on an annual basis. 7. 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  Valtrex 500 mg #30 with 1 refill.  1 p.o. twice daily times several days at earliest onset of outbreak. 8. Health maintenance.  No routine lab work done as patient does this elsewhere.  Follow-up 1 year, sooner as needed.   Anastasio Auerbach MD, 3:09 PM 03/01/2019

## 2019-03-01 NOTE — Patient Instructions (Signed)
Take the Diflucan pill daily for 3 days.  Follow-up if the skin irritation/rash continues.  Follow-up in 1 year for annual exam.

## 2019-03-01 NOTE — Addendum Note (Signed)
Addended by: Nelva Nay on: 03/01/2019 03:25 PM   Modules accepted: Orders

## 2019-03-02 LAB — PAP IG W/ RFLX HPV ASCU

## 2019-03-08 ENCOUNTER — Other Ambulatory Visit: Payer: Self-pay | Admitting: Gynecology

## 2019-09-11 ENCOUNTER — Other Ambulatory Visit: Payer: Self-pay | Admitting: *Deleted

## 2019-09-11 MED ORDER — ESTRADIOL 1 MG PO TABS: 1.0000 mg | ORAL_TABLET | Freq: Every day | ORAL | 1 refills | Status: DC

## 2020-04-21 ENCOUNTER — Other Ambulatory Visit: Payer: Self-pay | Admitting: Obstetrics and Gynecology

## 2020-04-27 ENCOUNTER — Other Ambulatory Visit: Payer: Self-pay | Admitting: Obstetrics and Gynecology

## 2020-04-30 ENCOUNTER — Other Ambulatory Visit: Payer: Self-pay | Admitting: Obstetrics and Gynecology

## 2020-05-02 ENCOUNTER — Other Ambulatory Visit: Payer: Self-pay | Admitting: Obstetrics and Gynecology

## 2020-05-06 ENCOUNTER — Other Ambulatory Visit: Payer: Self-pay | Admitting: Obstetrics and Gynecology

## 2020-05-08 ENCOUNTER — Other Ambulatory Visit: Payer: Self-pay | Admitting: Obstetrics and Gynecology

## 2020-05-08 NOTE — Telephone Encounter (Signed)
annual exam scheduled on 05/12/20 with you.

## 2020-05-09 NOTE — Telephone Encounter (Signed)
Please call patient and let her know that since her annual exam is so close, I would like to discuss a safer alternative before prescribing this medication. I look forward to working with her to develop a good plan for her management of symptoms. Thanks, [employer]

## 2020-05-11 ENCOUNTER — Other Ambulatory Visit: Payer: Self-pay | Admitting: Nurse Practitioner

## 2020-05-11 ENCOUNTER — Encounter: Payer: Self-pay | Admitting: Nurse Practitioner

## 2020-05-11 DIAGNOSIS — N951 Menopausal and female climacteric states: Secondary | ICD-10-CM

## 2020-05-11 MED ORDER — ESTRADIOL 1 MG PO TABS: 1.0000 mg | ORAL_TABLET | Freq: Every day | ORAL | 0 refills | Status: DC

## 2020-05-12 ENCOUNTER — Ambulatory Visit: Payer: 59 | Admitting: Nurse Practitioner

## 2020-05-15 ENCOUNTER — Other Ambulatory Visit: Payer: Self-pay

## 2020-05-20 ENCOUNTER — Other Ambulatory Visit: Payer: Self-pay

## 2020-05-20 ENCOUNTER — Telehealth: Payer: Self-pay

## 2020-05-20 ENCOUNTER — Other Ambulatory Visit: Payer: Self-pay | Admitting: Nurse Practitioner

## 2020-05-20 MED ORDER — VALACYCLOVIR HCL 500 MG PO TABS: ORAL_TABLET | ORAL | 0 refills | Status: DC

## 2020-05-20 NOTE — Telephone Encounter (Signed)
Received faxed refill request for Estradiol 1mg  #90 from [facility]. Last AEX 03-01-19 and patient was notified on 05-11-20 she needed to R/S AEX appt.  Called patient and left message to call regarding appt.(for AEX before refilling Estradiol again). Karma Ganja, NP gave 30 day supply on 05-11-20.

## 2020-05-20 NOTE — Telephone Encounter (Signed)
Medication refill request: Valtrex 500mg  #30,R0 Last AEX:  03-01-19 Next AEX: None Last MMG (if hormonal medication request): n/a Refill authorized: Please authorize if appropriate  Routed to Karma Ganja, NP

## 2020-06-06 ENCOUNTER — Other Ambulatory Visit: Payer: Self-pay | Admitting: Nurse Practitioner

## 2020-06-06 DIAGNOSIS — N951 Menopausal and female climacteric states: Secondary | ICD-10-CM

## 2020-06-06 NOTE — Telephone Encounter (Signed)
Katelyn Salinas patient is scheduled on 06/20/20 for annual exam. You approved 30 day only supply on 05/11/20.

## 2020-06-11 ENCOUNTER — Other Ambulatory Visit: Payer: Self-pay | Admitting: Family Medicine

## 2020-06-11 DIAGNOSIS — Z1231 Encounter for screening mammogram for malignant neoplasm of breast: Secondary | ICD-10-CM

## 2020-06-20 ENCOUNTER — Encounter: Payer: Self-pay | Admitting: Nurse Practitioner

## 2020-06-20 ENCOUNTER — Other Ambulatory Visit: Payer: Self-pay

## 2020-06-20 ENCOUNTER — Ambulatory Visit (INDEPENDENT_AMBULATORY_CARE_PROVIDER_SITE_OTHER): Payer: Managed Care, Other (non HMO) | Admitting: Nurse Practitioner

## 2020-06-20 VITALS — BP 110/74 | HR 72 | Resp 16 | Ht 65.0 in | Wt 175.0 lb

## 2020-06-20 DIAGNOSIS — Z01419 Encounter for gynecological examination (general) (routine) without abnormal findings: Secondary | ICD-10-CM

## 2020-06-20 DIAGNOSIS — N951 Menopausal and female climacteric states: Secondary | ICD-10-CM | POA: Diagnosis not present

## 2020-06-20 MED ORDER — ESTRADIOL 1 MG PO TABS: 1.0000 mg | ORAL_TABLET | Freq: Every day | ORAL | 12 refills | Status: DC

## 2020-06-20 NOTE — Patient Instructions (Signed)
[email or web address]   Kegel Exercises  Kegel exercises can help strengthen your pelvic floor muscles. The pelvic floor is a group of muscles that support your rectum, small intestine, and bladder. In females, pelvic floor muscles also help support the womb (uterus). These muscles help you control the flow of urine and stool. Kegel exercises are painless and simple, and they do not require any equipment. Your provider may suggest Kegel exercises to:  Improve bladder and bowel control.  Improve sexual response.  Improve weak pelvic floor muscles after surgery to remove the uterus (hysterectomy) or pregnancy (females).  Improve weak pelvic floor muscles after prostate gland removal or surgery (males). Kegel exercises involve squeezing your pelvic floor muscles, which are the same muscles you squeeze when you try to stop the flow of urine or keep from passing gas. The exercises can be done while sitting, standing, or lying down, but it is best to vary your position. Exercises How to do Kegel exercises: 1. Squeeze your pelvic floor muscles tight. You should feel a tight lift in your rectal area. If you are a female, you should also feel a tightness in your vaginal area. Keep your stomach, buttocks, and legs relaxed. 2. Hold the muscles tight for up to 10 seconds. 3. Breathe normally. 4. Relax your muscles. 5. Repeat as told by your health care provider. Repeat this exercise daily as told by your health care provider. Continue to do this exercise for at least 4-6 weeks, or for as long as told by your health care provider. You may be referred to a physical therapist who can help you learn more about how to do Kegel exercises. Depending on your condition, your health care provider may recommend:  Varying how long you squeeze your muscles.  Doing several sets of exercises every day.  Doing exercises  for several weeks.  Making Kegel exercises a part of your regular exercise routine. This information is not intended to replace advice given to you by your health care provider. Make sure you discuss any questions you have with your health care provider. Document Revised: 08/17/2019 Document Reviewed: 11/30/2017 Elsevier Patient Education  [place].

## 2020-06-20 NOTE — Progress Notes (Signed)
60 y.o. G92P1001 Married Katelyn Salinas here for annual exam.      Pt had TAH/BSO 2018 (done for bleeding/fibroid) Since then has been on estradiol. She really wants to stay on it, still has hot flashes at times.   Feels good, only one concern is tenderness around umbilicus when lying on her abdomen. This has been since her hysterectomy.  Patient's last menstrual period was 02/27/2016 (lmp unknown).          Sexually active: Yes.    The current method of family planning is status post hysterectomy.    Exercising: Yes.     Smoker:  no  Health Maintenance: Pap:  03-01-2019 neg History of abnormal Pap:  no MMG:  02-01-2019 category b density birads 1:neg, scheduled 07-14-2020 Colonoscopy: 09-26-2015 f/u 39yrs BMD:   none TDaP:  2014 Gardasil:   n/a Covid-19: pfizer Hep C testing: neg 2016    reports that she has never smoked. She has never used smokeless tobacco. She reports that she does not drink alcohol and does not use drugs.  Past Medical History:  Diagnosis Date  . Anemia   . Asthma    Hx childhood - no inhaler, no problems as adult  . Blood transfusion without reported diagnosis   . HSV (herpes simplex virus) anogenital infection 10/2017  . Hypertension     Past Surgical History:  Procedure Laterality Date  . ABDOMINAL HYSTERECTOMY N/A 07/13/2016   Procedure: HYSTERECTOMY ABDOMINAL;  Surgeon: Anastasio Auerbach, MD;  Location: [facility];  Service: Gynecology;  Laterality: N/A;  Request 7:30am OR time  Request 2 hours OR time  Myrtie Neither will be here for the open vessel sealer  . BREAST SURGERY     RIGHT BREAST LUMP; BREAST REDUCTION IN 2006   . COLONOSCOPY    . LASER ABLATION OF THE CERVIX  1989   CIN-1  . REDUCTION MAMMAPLASTY Bilateral   . SALPINGOOPHORECTOMY Bilateral 07/13/2016   Procedure: SALPINGO OOPHORECTOMY;  Surgeon: Anastasio Auerbach, MD;  Location: [facility];  Service: Gynecology;  Laterality: Bilateral;  . TUBAL LIGATION  1989    Current  Outpatient Medications  Medication Sig Dispense Refill  . ALPRAZolam (XANAX) 0.25 MG tablet Take 0.25 mg by mouth 3 (three) times daily as needed for anxiety.   0  . amphetamine-dextroamphetamine (ADDERALL XR) 10 MG 24 hr capsule Take 10 mg by mouth every morning.    Marland Kitchen augmented betamethasone dipropionate (DIPROLENE-AF) 0.05 % ointment Apply topically 2 (two) times daily as needed.    . desvenlafaxine (PRISTIQ) 100 MG 24 hr tablet Take 100 mg by mouth daily.    Marland Kitchen estradiol (ESTRACE) 1 MG tablet TAKE 1 TABLET BY MOUTH EVERY DAY 30 tablet 0  . ibuprofen (ADVIL,MOTRIN) 200 MG tablet Take 400 mg by mouth every 6 (six) hours as needed.    . Multiple Vitamin (MULTIVITAMIN PO) Take 1 tablet by mouth daily.    Marland Kitchen terbinafine (LAMISIL) 250 MG tablet Take 250 mg by mouth daily.    . valACYclovir (VALTREX) 500 MG tablet Take 1 po twice daily for outbreak 30 tablet 0  . valsartan-hydrochlorothiazide (DIOVAN-HCT) 160-12.5 MG tablet Take 1 tablet by mouth daily.  11   No current facility-administered medications for this visit.    Family History  Problem Relation Age of Onset  . Hypertension Mother   . Hypertension Father   . Heart disease Father   . Hypertension Brother   . Heart disease Brother   . Alzheimer's  disease Paternal Grandmother   . Cancer Maternal Aunt        Pancreatic  . Cancer Maternal Uncle        Lung  . Cancer Maternal Aunt        Lung  . Colon cancer Neg Hx     Review of Systems  Constitutional: Negative.   HENT: Negative.   Eyes: Negative.   Respiratory: Negative.   Cardiovascular: Negative.   Gastrointestinal: Negative.   Endocrine: Negative.   Genitourinary:       Soreness in lower pelvic area when laying on stomach  Musculoskeletal: Negative.   Skin: Negative.   Allergic/Immunologic: Negative.   Neurological: Negative.   Hematological: Negative.   Psychiatric/Behavioral: Negative.     Exam:   BP 110/74   Pulse 72   Resp 16   Ht 5\' 5"  (1.651 m)   Wt 175  lb (79.4 kg)   LMP 02/27/2016 (LMP Unknown)   BMI 29.12 kg/m   Height: 5\' 5"  (165.1 cm)  General appearance: alert, cooperative and appears stated age, no acute distress Head: Normocephalic, without obvious abnormality Neck: no adenopathy, thyroid normal to inspection and palpation Lungs: clear to auscultation bilaterally Breasts: No axillary or supraclavicular adenopathy, Normal to palpation without dominant masses Heart: regular rate and rhythm Abdomen: soft, non-tender; no masses,  no organomegaly Extremities: extremities normal, no edema Skin: No rashes or lesions Lymph nodes: Cervical, supraclavicular, and axillary nodes normal. No abnormal inguinal nodes palpated Neurologic: Grossly normal   Pelvic: External genitalia:  no lesions              Urethra:  normal appearing urethra with no masses, tenderness or lesions              Bartholins and Skenes: normal                 Vagina: thin tissue              Cervix: absent             Bimanual Exam:   Uterus:  Absent              Adnexa: no mass, fullness, tenderness               Rectal: no palpable mass    A/P:  Well woman exam with routine gynecological exam  Menopausal state - Plan: estradiol (ESTRACE) 1 MG tablet  Discussed pro/cons of estrogen. Encouraged changing to patch. Written information provided. Pt would prefer to stay on oral estradiol. Will research. Discussed maybe next year will decrease dose. Pap :n/a, may discontinue Mammogram: scheduled 07/14/2020 Labs:To be drawn with PCP F/U 1 year

## 2020-07-14 ENCOUNTER — Inpatient Hospital Stay: Admission: RE | Admit: 2020-07-14 | Payer: Managed Care, Other (non HMO) | Source: Ambulatory Visit

## 2020-07-14 DIAGNOSIS — Z1231 Encounter for screening mammogram for malignant neoplasm of breast: Secondary | ICD-10-CM

## 2020-08-20 ENCOUNTER — Other Ambulatory Visit: Payer: Self-pay

## 2020-08-20 ENCOUNTER — Ambulatory Visit
Admission: RE | Admit: 2020-08-20 | Discharge: 2020-08-20 | Disposition: A | Discharge status: Home / Self Care | Payer: Managed Care, Other (non HMO) | Source: Ambulatory Visit | Attending: Family Medicine | Admitting: Family Medicine

## 2020-08-20 DIAGNOSIS — Z1231 Encounter for screening mammogram for malignant neoplasm of breast: Secondary | ICD-10-CM

## 2020-08-22 ENCOUNTER — Other Ambulatory Visit: Payer: Self-pay | Admitting: Family Medicine

## 2020-08-22 DIAGNOSIS — R928 Other abnormal and inconclusive findings on diagnostic imaging of breast: Secondary | ICD-10-CM

## 2020-09-17 ENCOUNTER — Ambulatory Visit
Admission: RE | Admit: 2020-09-17 | Discharge: 2020-09-17 | Disposition: A | Discharge status: Home / Self Care | Payer: Managed Care, Other (non HMO) | Source: Ambulatory Visit | Attending: Family Medicine | Admitting: Family Medicine

## 2020-09-17 ENCOUNTER — Other Ambulatory Visit: Payer: Self-pay

## 2020-09-17 ENCOUNTER — Ambulatory Visit: Payer: Managed Care, Other (non HMO)

## 2020-09-17 DIAGNOSIS — R928 Other abnormal and inconclusive findings on diagnostic imaging of breast: Secondary | ICD-10-CM

## 2020-09-19 ENCOUNTER — Other Ambulatory Visit: Payer: Managed Care, Other (non HMO)

## 2021-04-21 ENCOUNTER — Other Ambulatory Visit: Payer: Self-pay

## 2021-04-21 MED ORDER — VALACYCLOVIR HCL 500 MG PO TABS: ORAL_TABLET | ORAL | 1 refills | Status: DC

## 2021-04-21 NOTE — Telephone Encounter (Signed)
Patient informed Rx sent. Advised AEX due after 06/20/21 and appointments are running that far out. She agrees I will have appt desk call her tomorrow to arrange appt.

## 2021-04-21 NOTE — Telephone Encounter (Signed)
AEX 06/20/20 with Karma Ganja, NP.  Requesting refill on Valacyclovir.

## 2021-08-10 ENCOUNTER — Other Ambulatory Visit: Payer: Self-pay | Admitting: Nurse Practitioner

## 2021-08-10 NOTE — Telephone Encounter (Signed)
Last AEX 06/20/2020  ?Scheduled AEX 09/14/2021 ?

## 2021-08-20 ENCOUNTER — Other Ambulatory Visit: Payer: Self-pay

## 2021-08-20 DIAGNOSIS — N951 Menopausal and female climacteric states: Secondary | ICD-10-CM

## 2021-08-20 MED ORDER — ESTRADIOL 1 MG PO TABS: 1.0000 mg | ORAL_TABLET | Freq: Every day | ORAL | 0 refills | Status: DC

## 2021-08-20 NOTE — Telephone Encounter (Signed)
LAEX-06/20/20--scheduled 09/14/21 ?Last mammo-08/20/20.  ?

## 2021-09-14 ENCOUNTER — Encounter: Payer: Self-pay | Admitting: Obstetrics & Gynecology

## 2021-09-14 ENCOUNTER — Ambulatory Visit (INDEPENDENT_AMBULATORY_CARE_PROVIDER_SITE_OTHER): Payer: Managed Care, Other (non HMO) | Admitting: Obstetrics & Gynecology

## 2021-09-14 VITALS — BP 126/82 | Ht 65.0 in | Wt 179.0 lb

## 2021-09-14 DIAGNOSIS — Z79890 Hormone replacement therapy (postmenopausal): Secondary | ICD-10-CM | POA: Diagnosis not present

## 2021-09-14 DIAGNOSIS — Z01419 Encounter for gynecological examination (general) (routine) without abnormal findings: Secondary | ICD-10-CM

## 2021-09-14 DIAGNOSIS — Z90722 Acquired absence of ovaries, bilateral: Secondary | ICD-10-CM | POA: Diagnosis not present

## 2021-09-14 DIAGNOSIS — Z9079 Acquired absence of other genital organ(s): Secondary | ICD-10-CM

## 2021-09-14 DIAGNOSIS — Z90710 Acquired absence of both cervix and uterus: Secondary | ICD-10-CM | POA: Diagnosis not present

## 2021-09-14 MED ORDER — ESTRADIOL 1 MG PO TABS: 1.0000 mg | ORAL_TABLET | Freq: Every day | ORAL | 4 refills | Status: DC

## 2021-09-14 NOTE — Progress Notes (Signed)
Katelyn Salinas 09/30/1960 [id number]   History:    61 y.o. G1P1L1 Married  RP:  Established patient presenting for annual gyn exam   HPI: TAH/BSO 2018 (bleeding/fibroids).  On estradiol 1 mg PO daily.  No pelvic pain.  Sexually active.  Pap 03-01-19 Neg.  Stable tenderness below the umbilicus in the midline when lying on her abdomen. This has been since her hysterectomy.  No bulging felt at that level.  Breasts normal.  MMG 09-17-20 Negative.  Colon 2017.  DEXA 10-22-09 was normal.  BMI 29.79.  Health labs with Fam MD.   Past medical history,surgical history, family history and social history were all reviewed and documented in the EPIC chart.  Gynecologic History Patient's last menstrual period was 02/27/2016 (lmp unknown).  Obstetric History OB History  Gravida Para Term Preterm AB Living  '1 1 1     1  '$ SAB IAB Ectopic Multiple Live Births               # Outcome Date GA Lbr Len/2nd Weight Sex Delivery Anes PTL Lv  1 Term              ROS: A ROS was performed and pertinent positives and negatives are included in the history.  GENERAL: No fevers or chills. HEENT: No change in vision, no earache, sore throat or sinus congestion. NECK: No pain or stiffness. CARDIOVASCULAR: No chest pain or pressure. No palpitations. PULMONARY: No shortness of breath, cough or wheeze. GASTROINTESTINAL: No abdominal pain, nausea, vomiting or diarrhea, melena or bright red blood per rectum. GENITOURINARY: No urinary frequency, urgency, hesitancy or dysuria. MUSCULOSKELETAL: No joint or muscle pain, no back pain, no recent trauma. DERMATOLOGIC: No rash, no itching, no lesions. ENDOCRINE: No polyuria, polydipsia, no heat or cold intolerance. No recent change in weight. HEMATOLOGICAL: No anemia or easy bruising or bleeding. NEUROLOGIC: No headache, seizures, numbness, tingling or weakness. PSYCHIATRIC: No depression, no loss of interest in normal activity or change in sleep pattern.     Exam:   BP  126/82   Ht '5\' 5"'$  (1.651 m)   Wt 179 lb (81.2 kg)   LMP 02/27/2016 (LMP Unknown)   BMI 29.79 kg/m   Body mass index is 29.79 kg/m.  General appearance : Well developed well nourished female. No acute distress HEENT: Eyes: no retinal hemorrhage or exudates,  Neck supple, trachea midline, no carotid bruits, no thyroidmegaly Lungs: Clear to auscultation, no rhonchi or wheezes, or rib retractions  Heart: Regular rate and rhythm, no murmurs or gallops Breast:Examined in sitting and supine position were symmetrical in appearance, no palpable masses or tenderness,  no skin retraction, no nipple inversion, no nipple discharge, no skin discoloration, no axillary or supraclavicular lymphadenopathy Abdomen: no palpable masses or tenderness, no rebound or guarding Extremities: no edema or skin discoloration or tenderness  Pelvic: Vulva: Normal             Vagina: No gross lesions or discharge  Cervix: No gross lesions or discharge  Uterus  AV, normal size, shape and consistency, non-tender and mobile  Adnexa  Without masses or tenderness  Anus: Normal   Assessment/Plan:  61 y.o. female for annual exam   1. Well woman exam with routine gynecological exam TAH/BSO 2018 (bleeding/fibroids).  On estradiol 1 mg PO daily.  No pelvic pain.  Sexually active.  Pap 03-01-19 Neg.  Stable tenderness below the umbilicus in the midline when lying on her abdomen. This has been since her hysterectomy.  No bulging felt at that level.  Breasts normal.  MMG 09-17-20 Negative.  Colon 2017.  DEXA 10-22-09 was normal.  BMI 29.79.  Health labs with Fam MD.  2. S/P TAH-BSO  3. Postmenopausal hormone replacement therapy TAH/BSO 2018 (bleeding/fibroids).  Postmenopause, well on estradiol 1 mg PO daily.  No pelvic pain.  Sexually active.  - estradiol (ESTRACE) 1 MG tablet; Take 1 tablet (1 mg total) by mouth daily.   Princess Bruins MD, 2:38 PM 09/14/2021

## 2021-11-14 ENCOUNTER — Encounter (HOSPITAL_BASED_OUTPATIENT_CLINIC_OR_DEPARTMENT_OTHER): Payer: Self-pay | Admitting: Emergency Medicine

## 2021-11-14 ENCOUNTER — Emergency Department (HOSPITAL_BASED_OUTPATIENT_CLINIC_OR_DEPARTMENT_OTHER): Payer: Managed Care, Other (non HMO)

## 2021-11-14 ENCOUNTER — Other Ambulatory Visit: Payer: Self-pay

## 2021-11-14 ENCOUNTER — Emergency Department (HOSPITAL_BASED_OUTPATIENT_CLINIC_OR_DEPARTMENT_OTHER)
Admission: EM | Admit: 2021-11-14 | Discharge: 2021-11-14 | Disposition: A | Discharge status: Home / Self Care | Payer: Managed Care, Other (non HMO) | Attending: Emergency Medicine | Admitting: Emergency Medicine

## 2021-11-14 DIAGNOSIS — J069 Acute upper respiratory infection, unspecified: Secondary | ICD-10-CM | POA: Diagnosis not present

## 2021-11-14 DIAGNOSIS — R059 Cough, unspecified: Secondary | ICD-10-CM | POA: Diagnosis present

## 2021-11-14 DIAGNOSIS — U071 COVID-19: Secondary | ICD-10-CM | POA: Diagnosis not present

## 2021-11-14 DIAGNOSIS — Z79899 Other long term (current) drug therapy: Secondary | ICD-10-CM | POA: Insufficient documentation

## 2021-11-14 DIAGNOSIS — I10 Essential (primary) hypertension: Secondary | ICD-10-CM | POA: Insufficient documentation

## 2021-11-14 LAB — BASIC METABOLIC PANEL
Anion gap: 8 (ref 5–15)
BUN: 20 mg/dL (ref 6–20)
CO2: 31 mmol/L (ref 22–32)
Calcium: 9.7 mg/dL (ref 8.9–10.3)
Chloride: 99 mmol/L (ref 98–111)
Creatinine, Ser: 0.97 mg/dL (ref 0.44–1.00)
GFR, Estimated: >60 mL/min (ref >60)
Glucose, Bld: 103 mg/dL — ABNORMAL HIGH (ref 70–99)
Potassium: 4 mmol/L (ref 3.5–5.1)
Sodium: 138 mmol/L (ref 135–145)

## 2021-11-14 MED ORDER — NIRMATRELVIR/RITONAVIR (PAXLOVID)TABLET: 3.0000 | ORAL_TABLET | Freq: Two times a day (BID) | ORAL | 0 refills | Status: DC

## 2021-11-14 MED ORDER — NIRMATRELVIR/RITONAVIR (PAXLOVID)TABLET: 3.0000 | ORAL_TABLET | Freq: Two times a day (BID) | ORAL | 0 refills | Status: AC

## 2021-11-14 NOTE — ED Provider Notes (Signed)
[facility] Provider Note   CSN: [id number] Arrival date & time: 11/14/21  1126     History  Chief Complaint  Patient presents with   URI    Katelyn Salinas is a 61 y.o. female.  Patient is a 61 year old female who presents with cough and cold symptoms.  She has had symptoms that started 5 days ago.  This is the fifth day.  She tested positive for COVID with a home test.  She has other family members who are also COVID-positive.  She has runny nose congestion and coughing.  She had some fever and chills the first couple days but those have improved.  She still has fatigue and some shortness of breath on ambulation.  No nausea vomiting or diarrhea.  She has a history of hypertension.       Home Medications Prior to Admission medications   Medication Sig Start Date End Date Taking? Authorizing Provider  nirmatrelvir/ritonavir EUA (PAXLOVID) 20 x 150 MG & 10 x '100MG'$  TABS Take 3 tablets by mouth 2 (two) times daily for 5 days. Patient GFR is >60 . Take nirmatrelvir (150 mg) two tablets twice daily for 5 days and ritonavir (100 mg) one tablet twice daily for 5 days. 11/14/21 11/19/21 Yes Malvin Johns, MD  ALPRAZolam Duanne Moron) 0.25 MG tablet Take 0.25 mg by mouth 3 (three) times daily as needed for anxiety.  03/25/16   [provider]  amphetamine-dextroamphetamine (ADDERALL XR) 10 MG 24 hr capsule Take 10 mg by mouth every morning. 06/16/20   [provider]  desvenlafaxine (PRISTIQ) 100 MG 24 hr tablet Take 100 mg by mouth daily.    [provider]  estradiol (ESTRACE) 1 MG tablet Take 1 tablet (1 mg total) by mouth daily. 09/14/21   Princess Bruins, MD  ibuprofen (ADVIL,MOTRIN) 200 MG tablet Take 400 mg by mouth every 6 (six) hours as needed.    [provider]  Multiple Vitamin (MULTIVITAMIN PO) Take 1 tablet by mouth daily.    [provider]  valACYclovir (VALTREX) 500 MG tablet TAKE 1 TABLET BY MOUTH  TWICE DAILY for 3 days with an  OUTBREAK 08/11/21   Salvadore Dom, MD  valsartan-hydrochlorothiazide (DIOVAN-HCT) 160-12.5 MG tablet Take 1 tablet by mouth daily. 05/06/16   [provider]      Allergies    Patient has no known allergies.    Review of Systems   Review of Systems  Constitutional:  Positive for fatigue and fever. Negative for chills and diaphoresis.  HENT:  Positive for congestion and rhinorrhea. Negative for sneezing.   Eyes: Negative.   Respiratory:  Positive for cough and shortness of breath. Negative for chest tightness.   Cardiovascular:  Negative for chest pain and leg swelling.  Gastrointestinal:  Negative for abdominal pain, blood in stool, diarrhea, nausea and vomiting.  Genitourinary:  Negative for difficulty urinating, flank pain, frequency and hematuria.  Musculoskeletal:  Positive for myalgias. Negative for arthralgias and back pain.  Skin:  Negative for rash.  Neurological:  Negative for dizziness, speech difficulty, weakness, numbness and headaches.    Physical Exam Updated Vital Signs BP (!) 133/56   Pulse 88   Temp 98.7 F (37.1 C)   Resp 16   LMP 02/27/2016 (LMP Unknown)   SpO2 100%  Physical Exam Constitutional:      Appearance: She is well-developed.  HENT:     Head: Normocephalic and atraumatic.  Eyes:     Pupils: Pupils are equal,  round, and reactive to light.  Cardiovascular:     Rate and Rhythm: Normal rate and regular rhythm.     Heart sounds: Normal heart sounds.  Pulmonary:     Effort: Pulmonary effort is normal. No respiratory distress.     Breath sounds: Normal breath sounds. No wheezing or rales.  Chest:     Chest wall: No tenderness.  Abdominal:     General: Bowel sounds are normal.     Palpations: Abdomen is soft.     Tenderness: There is no abdominal tenderness. There is no guarding or rebound.  Musculoskeletal:        General: Normal range of motion.     Cervical back: Normal range of motion and neck  supple.  Lymphadenopathy:     Cervical: No cervical adenopathy.  Skin:    General: Skin is warm and dry.     Findings: No rash.  Neurological:     Mental Status: She is alert and oriented to person, place, and time.     ED Results / Procedures / Treatments   Labs (all labs ordered are listed, but only abnormal results are displayed) Labs Reviewed  BASIC METABOLIC PANEL - Abnormal; Notable for the following components:      Result Value   Glucose, Bld 103 (*)    All other components within normal limits    EKG None  Radiology DG Chest Port 1 View  Result Date: 11/14/2021 CLINICAL DATA:  Shortness of breath, COVID positive EXAM: PORTABLE CHEST 1 VIEW COMPARISON:  None Available. FINDINGS: Transverse diameter of the heart is increased. There are no signs of pulmonary edema or focal pulmonary consolidation. Left lateral CP angle is indistinct. There is no pneumothorax. IMPRESSION: Cardiomegaly. There are no signs of pulmonary edema or focal pulmonary consolidation. Electronically Signed   By: Elmer Picker M.D.   On: 11/14/2021 15:58    Procedures Procedures    Medications Ordered in ED Medications - No data to display  ED Course/ Medical Decision Making/ A&P                           Medical Decision Making Amount and/or Complexity of Data Reviewed Labs: ordered. Radiology: ordered.   Patient is a 61 year old female who presents with cough and cold symptoms and a positive home COVID test.  She is well-appearing.  No hypoxia.  Her chest x-ray was interpreted by me and confirmed by the radiologist that show no evidence of pneumonia.  No pneumothorax or pulmonary edema.  She is on day 5 of symptoms.  I advised her if she wants to start antiviral medication she would need to start that today.  I discussed the options with her and she does want to move forward with the Paxlovid.  She had blood work done which shows a normal creatinine.  She was discharged home in good  condition.  Return precautions were given.  Final Clinical Impression(s) / ED Diagnoses Final diagnoses:  COVID-19 virus infection    Rx / DC Orders ED Discharge Orders          Ordered    nirmatrelvir/ritonavir EUA (PAXLOVID) 20 x 150 MG & 10 x '100MG'$  TABS  2 times daily        11/14/21 1749              Malvin Johns, MD 11/14/21 1750

## 2021-11-14 NOTE — ED Notes (Signed)
Attempted unsuccessful iv stick left forearm.

## 2021-11-14 NOTE — ED Triage Notes (Signed)
Pt tested positive for covid on Wednesday, chills and headaches first few days, have passed. Pt still with congestion and lethargy.

## 2022-03-08 ENCOUNTER — Other Ambulatory Visit: Payer: Self-pay | Admitting: Obstetrics & Gynecology

## 2022-03-08 DIAGNOSIS — Z1231 Encounter for screening mammogram for malignant neoplasm of breast: Secondary | ICD-10-CM

## 2022-03-11 ENCOUNTER — Ambulatory Visit
Admission: RE | Admit: 2022-03-11 | Discharge: 2022-03-11 | Disposition: A | Discharge status: Home / Self Care | Payer: Managed Care, Other (non HMO) | Source: Ambulatory Visit | Attending: Obstetrics & Gynecology | Admitting: Obstetrics & Gynecology

## 2022-03-11 DIAGNOSIS — Z1231 Encounter for screening mammogram for malignant neoplasm of breast: Secondary | ICD-10-CM

## 2022-07-08 ENCOUNTER — Other Ambulatory Visit: Payer: Self-pay | Admitting: Obstetrics and Gynecology

## 2022-07-08 NOTE — Telephone Encounter (Signed)
AEX 09/14/2021.

## 2022-10-05 ENCOUNTER — Other Ambulatory Visit: Payer: Self-pay | Admitting: Obstetrics & Gynecology

## 2022-10-05 DIAGNOSIS — Z79890 Hormone replacement therapy (postmenopausal): Secondary | ICD-10-CM

## 2022-10-06 NOTE — Telephone Encounter (Signed)
Medication refill request: estradiol 1 mg Last AEX:  09-14-21 Next AEX: not scheduled Last MMG (if hormonal medication request): 03-11-22 Refill authorized: pharmacy note placed letting patient know she needs to schedule exam for further refills. Please approve 1 mth if appropriate

## 2022-11-03 ENCOUNTER — Other Ambulatory Visit: Payer: Self-pay | Admitting: Obstetrics & Gynecology

## 2022-11-03 DIAGNOSIS — Z79890 Hormone replacement therapy (postmenopausal): Secondary | ICD-10-CM

## 2022-11-03 NOTE — Telephone Encounter (Signed)
Med refill request: estradiol 1 mg Last AEX: 09/14/21 Next AEX: not scheduled Last MMG (if hormonal med) 03/11/22 Refill authorized: Please Advise, last month was approved was one more refill. Pt needs aex. Asking front desk to schedule

## 2022-11-27 ENCOUNTER — Other Ambulatory Visit: Payer: Self-pay

## 2022-11-27 ENCOUNTER — Ambulatory Visit (HOSPITAL_BASED_OUTPATIENT_CLINIC_OR_DEPARTMENT_OTHER)
Admission: RE | Admit: 2022-11-27 | Discharge: 2022-11-27 | Disposition: A | Discharge status: Home / Self Care | Payer: Managed Care, Other (non HMO) | Source: Ambulatory Visit | Attending: Internal Medicine | Admitting: Internal Medicine

## 2022-11-27 ENCOUNTER — Ambulatory Visit (HOSPITAL_BASED_OUTPATIENT_CLINIC_OR_DEPARTMENT_OTHER): Payer: Managed Care, Other (non HMO) | Admitting: Radiology

## 2022-11-27 ENCOUNTER — Ambulatory Visit
Admission: EM | Admit: 2022-11-27 | Discharge: 2022-11-27 | Disposition: A | Discharge status: Home / Self Care | Payer: Managed Care, Other (non HMO) | Attending: Physician Assistant | Admitting: Physician Assistant

## 2022-11-27 DIAGNOSIS — M79671 Pain in right foot: Secondary | ICD-10-CM | POA: Diagnosis not present

## 2022-11-27 DIAGNOSIS — M7731 Calcaneal spur, right foot: Secondary | ICD-10-CM | POA: Diagnosis not present

## 2022-11-27 NOTE — ED Triage Notes (Signed)
"  I stepped on a needle in my mothers bedroom, I believe it broke off in my foot, right foot". DOI: 78-29 or 07-23.

## 2022-11-27 NOTE — ED Notes (Signed)
Pt to be outpatient

## 2022-11-27 NOTE — ED Notes (Signed)
Patient instructed to head to [facility] to have imaging performed d/t imaging problems with our facility today.

## 2022-11-28 ENCOUNTER — Encounter: Payer: Self-pay | Admitting: Physician Assistant

## 2022-11-28 NOTE — ED Provider Notes (Signed)
[facility]    CSN: [id number] Arrival date & time: 11/27/22  1204      History   Chief Complaint Chief Complaint  Patient presents with   Foot Injury    Right    HPI Katelyn Salinas is a 62 y.o. female.   Patient here today for evaluation of pain in her right lateral foot that she is concerned could be a needle she stepped on. She notes certain movements and the way she plants her foot will cause pain. She did lose a needle on her mother's floor and was not able to locate same. Suspected injury occurred almost 2 weeks ago. She denies any numbness or tingling.   The history is provided by the patient.  Foot Injury Associated symptoms: no fever     Past Medical History:  Diagnosis Date   Anemia    Asthma    Hx childhood - no inhaler, no problems as adult   Blood transfusion without reported diagnosis    HSV (herpes simplex virus) anogenital infection 10/2017   Hypertension     Patient Active Problem List   Diagnosis Date Noted   Leiomyoma 07/13/2016   BMI 30.0-30.9,adult 01/13/2015   Axillary mass 04/05/2013   Ovarian cyst, right 04/04/2013   History of cervical dysplasia 01/16/2013   Fibroid uterus 01/16/2013   Postmenopausal HRT (hormone replacement therapy) 01/16/2013    Past Surgical History:  Procedure Laterality Date   ABDOMINAL HYSTERECTOMY N/A 07/13/2016   Procedure: HYSTERECTOMY ABDOMINAL;  Surgeon: Dara Lords, MD;  Location: [facility];  Service: Gynecology;  Laterality: N/A;  Request 7:30am OR time  Request 2 hours OR time  Joslyn Devon will be here for the open vessel sealer   BREAST BIOPSY Right 04/12/2013   BREAST SURGERY     RIGHT BREAST LUMP; BREAST REDUCTION IN 2006    COLONOSCOPY     LASER ABLATION OF THE CERVIX  1989   CIN-1   REDUCTION MAMMAPLASTY Bilateral    SALPINGOOPHORECTOMY Bilateral 07/13/2016   Procedure: SALPINGO OOPHORECTOMY;  Surgeon: Dara Lords, MD;  Location: [facility];  Service: Gynecology;   Laterality: Bilateral;   TUBAL LIGATION  1989    OB History     Gravida  1   Para  1   Term  1   Preterm      AB      Living  1      SAB      IAB      Ectopic      Multiple      Live Births               Home Medications    Prior to Admission medications   Medication Sig Start Date End Date Taking? Authorizing Provider  ALPRAZolam (XANAX) 0.25 MG tablet Take 0.25 mg by mouth 3 (three) times daily as needed for anxiety.  03/25/16  Yes [provider]  amphetamine-dextroamphetamine (ADDERALL XR) 10 MG 24 hr capsule Take 10 mg by mouth every morning. 06/16/20  Yes [provider]  desvenlafaxine (PRISTIQ) 100 MG 24 hr tablet Take 100 mg by mouth daily.   Yes [provider]  estradiol (ESTRACE) 1 MG tablet TAKE 1 TABLET BY MOUTH ONCE DAILY . APPOINTMENT REQUIRED FOR FUTURE REFILLS 11/03/22  Yes Genia Del, MD  Multiple Vitamin (MULTIVITAMIN PO) Take 1 tablet by mouth daily.   Yes [provider]  valsartan-hydrochlorothiazide (DIOVAN-HCT) 160-12.5 MG tablet Take 1 tablet by mouth daily.  05/06/16  Yes [provider]  ibuprofen (ADVIL,MOTRIN) 200 MG tablet Take 400 mg by mouth every 6 (six) hours as needed.    [provider]  valACYclovir (VALTREX) 500 MG tablet TAKE 1 TABLET BY MOUTH TWICE DAILY FOR 3 DAYS WITH AN OUTBREAK 07/08/22   Romualdo Bolk, MD    Family History Family History  Problem Relation Age of Onset   Hypertension Mother    Hypertension Father    Heart disease Father    Hypertension Brother    Heart disease Brother    Cancer Maternal Aunt        Pancreatic   Cancer Maternal Aunt        Lung   Cancer Maternal Uncle        Lung   Alzheimer's disease Paternal Grandmother    Colon cancer Neg Hx     Social History Social History   Tobacco Use   Smoking status: Never   Smokeless tobacco: Never  Vaping Use   Vaping status: Never Used  Substance Use Topics   Alcohol use:  No    Alcohol/week: 0.0 standard drinks of alcohol   Drug use: No     Allergies   Patient has no known allergies.   Review of Systems Review of Systems  Constitutional:  Negative for chills and fever.  Eyes:  Negative for discharge and redness.  Respiratory:  Negative for shortness of breath.   Gastrointestinal:  Negative for nausea and vomiting.  Skin:  Negative for color change and wound.  Neurological:  Negative for numbness.     Physical Exam Triage Vital Signs ED Triage Vitals  Encounter Vitals Group     BP 11/27/22 1217 (!) 159/71     Systolic BP Percentile --      Diastolic BP Percentile --      Pulse Rate 11/27/22 1217 70     Resp 11/27/22 1217 18     Temp 11/27/22 1217 98.3 F (36.8 C)     Temp Source 11/27/22 1217 Oral     SpO2 11/27/22 1217 99 %     Weight 11/27/22 1215 175 lb (79.4 kg)     Height 11/27/22 1215 5\' 5"  (1.651 m)     Head Circumference --      Peak Flow --      Pain Score 11/27/22 1215 0     Pain Loc --      Pain Education --      Exclude from Growth Chart --    No data found.  Updated Vital Signs BP (!) 158/83 (BP Location: Right Arm)   Pulse 70   Temp 98.3 F (36.8 C) (Oral)   Resp 18   Ht 5\' 5"  (1.651 m)   Wt 175 lb (79.4 kg)   LMP 02/27/2016 (LMP Unknown)   SpO2 99%   BMI 29.12 kg/m   Physical Exam Vitals and nursing note reviewed.  Constitutional:      General: She is not in acute distress.    Appearance: Normal appearance. She is not ill-appearing.  HENT:     Head: Normocephalic and atraumatic.  Eyes:     Conjunctiva/sclera: Conjunctivae normal.  Cardiovascular:     Rate and Rhythm: Normal rate.  Pulmonary:     Effort: Pulmonary effort is normal. No respiratory distress.  Musculoskeletal:     Comments: Normal ROM of right toes, ankle  Skin:    Comments: No apparent entrance wound, no FB visualized  Neurological:  Mental Status: She is alert.  Psychiatric:        Mood and Affect: Mood normal.         Behavior: Behavior normal.        Thought Content: Thought content normal.      UC Treatments / Results  Labs (all labs ordered are listed, but only abnormal results are displayed) Labs Reviewed - No data to display  EKG   Radiology DG Foot Complete Right  Result Date: 11/27/2022 CLINICAL DATA:  Possible foreign body, plantar right foot pain, possibly stepped on something. EXAM: RIGHT FOOT COMPLETE - 3+ VIEW COMPARISON:  None Available. FINDINGS: There is no evidence of fracture or dislocation. Plantar calcaneal spur. There is no evidence of arthropathy or other focal bone abnormality. No radiopaque foreign body. No soft tissue gas. Soft tissues are unremarkable. IMPRESSION: 1. No radiopaque foreign body or soft tissue gas. 2. No acute osseous findings.  Plantar calcaneal spur. Electronically Signed   By: Narda Rutherford M.D.   On: 11/27/2022 15:31    Procedures Procedures (including critical care time)  Medications Ordered in UC Medications - No data to display  Initial Impression / Assessment and Plan / UC Course  I have reviewed the triage vital signs and the nursing notes.  Pertinent labs & imaging results that were available during my care of the patient were reviewed by me and considered in my medical decision making (see chart for details).    Xray ordered without evidence of FB. Recommend follow up if no gradual improvement or with any further concerns.   Final Clinical Impressions(s) / UC Diagnoses   Final diagnoses:  Right foot pain   Discharge Instructions   None    ED Prescriptions   None    PDMP not reviewed this encounter.   Tomi Bamberger, PA-C 11/28/22 1313

## 2022-12-13 ENCOUNTER — Other Ambulatory Visit: Payer: Self-pay | Admitting: Obstetrics & Gynecology

## 2022-12-13 DIAGNOSIS — Z79890 Hormone replacement therapy (postmenopausal): Secondary | ICD-10-CM

## 2023-01-07 ENCOUNTER — Other Ambulatory Visit: Payer: Self-pay | Admitting: *Deleted

## 2023-01-07 DIAGNOSIS — Z79890 Hormone replacement therapy (postmenopausal): Secondary | ICD-10-CM

## 2023-01-07 MED ORDER — ESTRADIOL 1 MG PO TABS: 1.0000 mg | ORAL_TABLET | Freq: Every day | ORAL | 1 refills | Status: DC

## 2023-01-07 NOTE — Telephone Encounter (Signed)
Call returned to patient to notify. Left detailed message, ok per dpr. Advised Rx sent to requested pharmacy with 1 RF. Keep AEX as scheduled for future refills. Return call to office if any additional questions.

## 2023-01-07 NOTE — Telephone Encounter (Signed)
Med refill request:estradiol 1 mg tab PO daily Last AEX: 09/14/21 -ML Next AEX: 02/15/23 -GH Last MMG (if hormonal med) 03/11/22 -BiRad 2 benign  Rx previously denied, needed AEX.   Rx pended #30/1RF with message needs AEX for further RF Refill authorized: Please Advise?

## 2023-01-14 IMAGING — MG MM DIGITAL SCREENING BILAT W/ TOMO AND CAD
6 of 10 series · 6 of 30 positions shown · non-contrast
Comparison: Previous exam(s).

CLINICAL DATA: Screening.

EXAM:
DIGITAL SCREENING BILATERAL MAMMOGRAM WITH TOMOSYNTHESIS AND CAD
TECHNIQUE: Bilateral screening digital craniocaudal and mediolateral oblique
mammograms were obtained. Bilateral screening digital breast
tomosynthesis was performed. The images were evaluated with
computer-aided detection.

[R CC synth-2D]
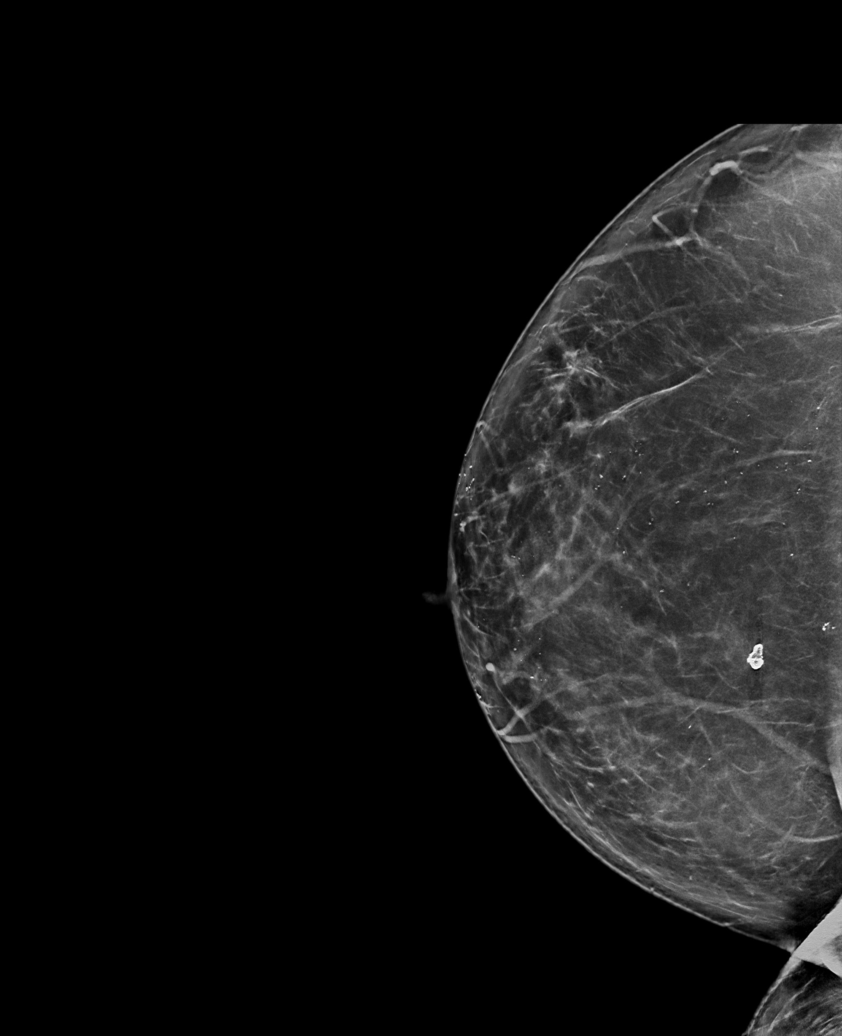

[L MLO synth-2D]
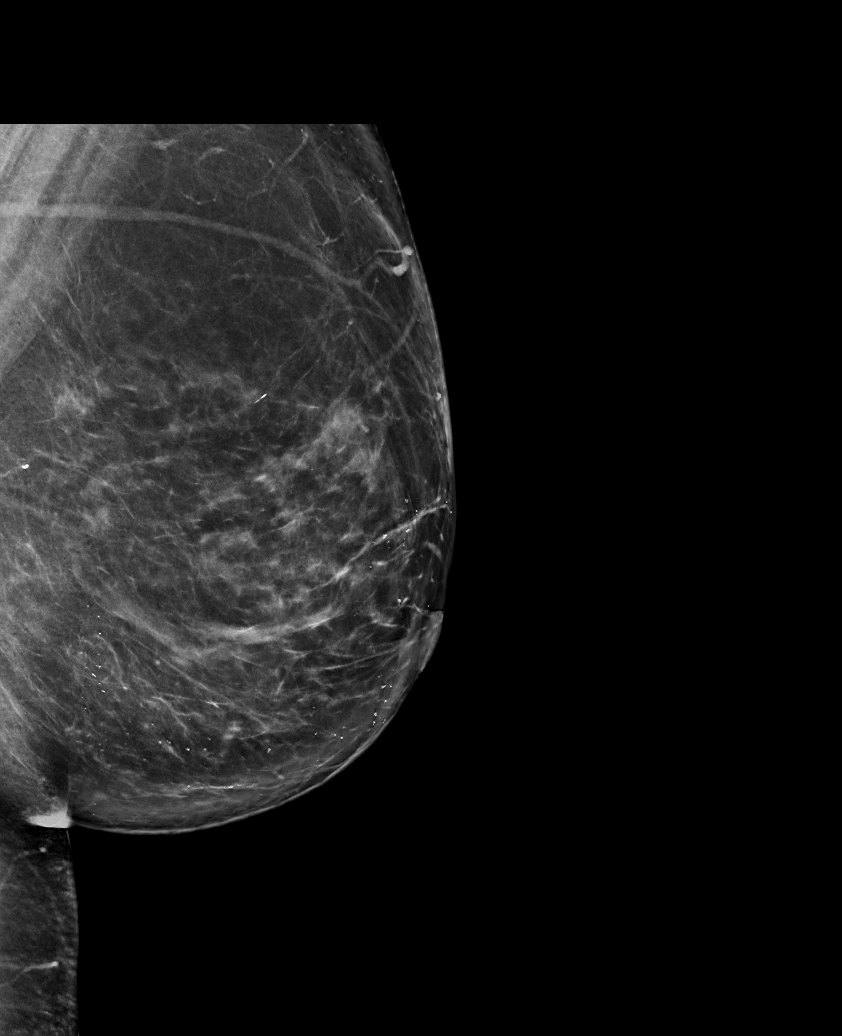

[L CC synth-2D]
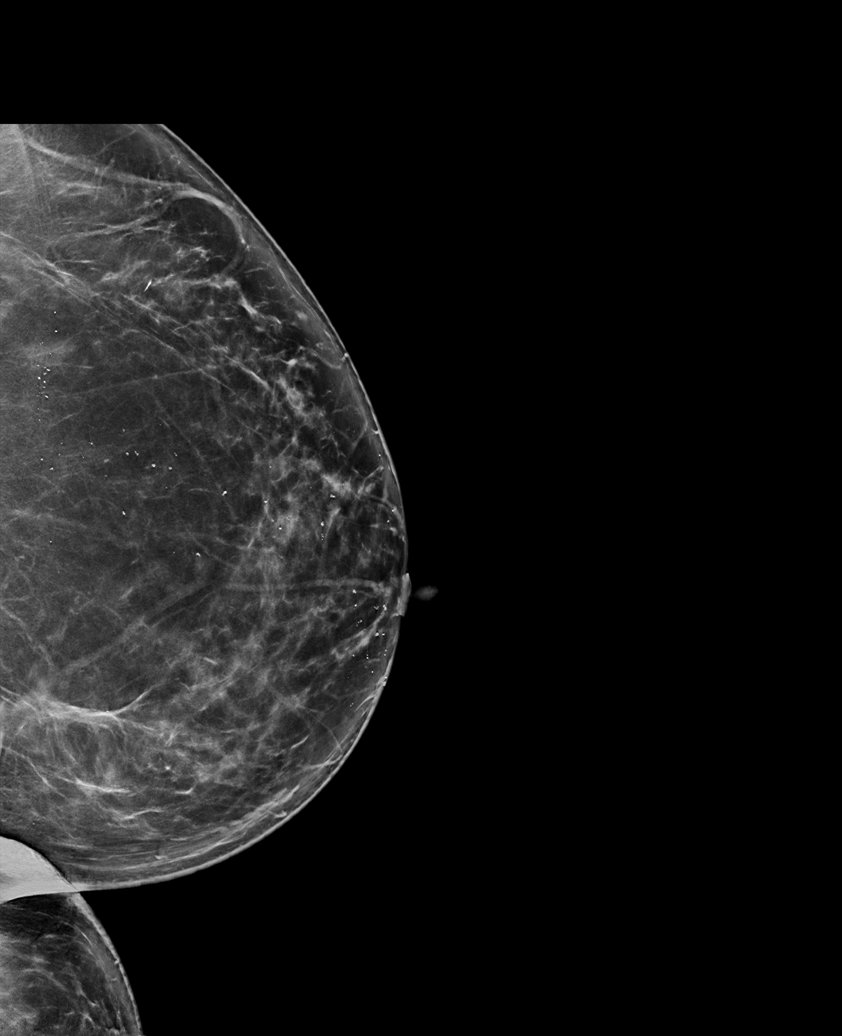

[R MLO synth-2D]
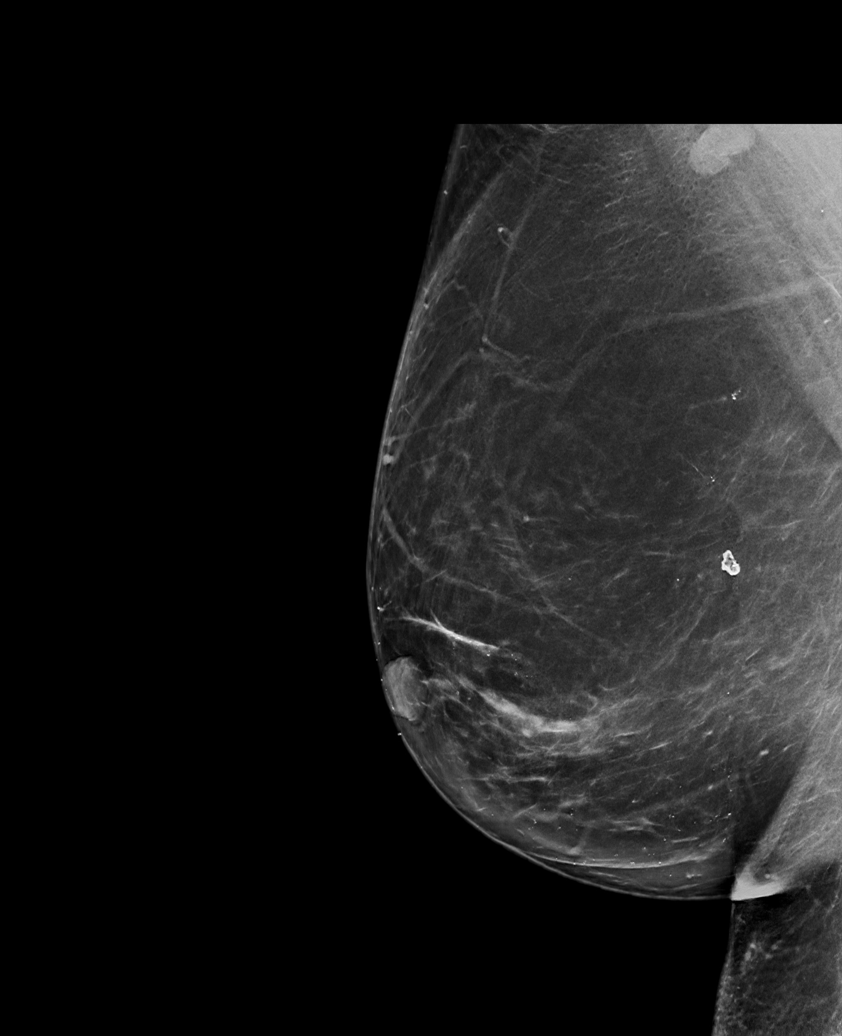

[R CV synth-2D]
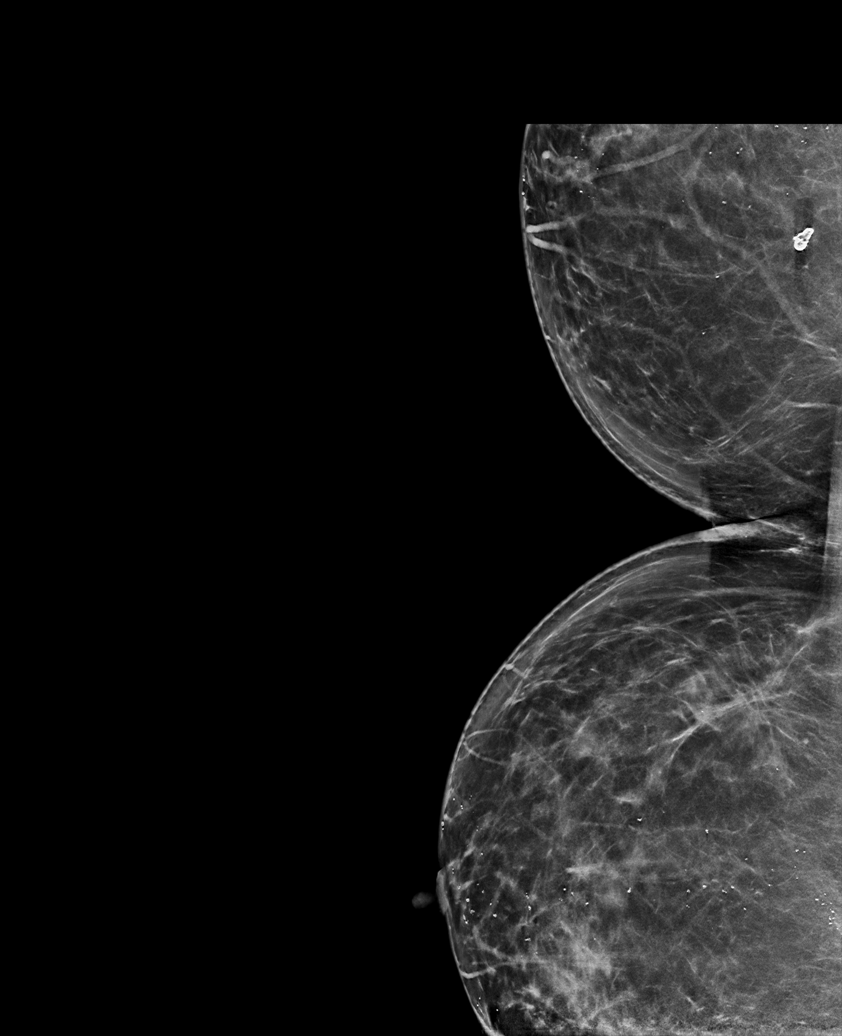

[R MLO tomo · tomo slice 54/107.0]
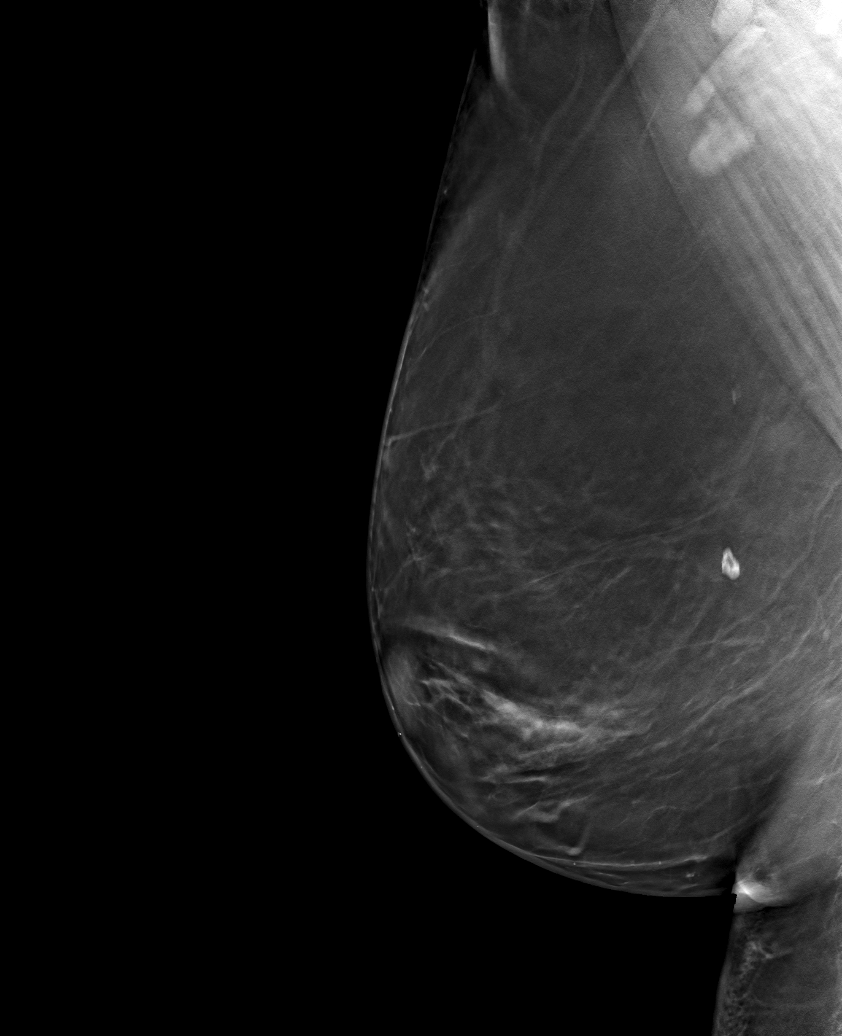

[6 of 30 positions shown; findings below may reference images not displayed]

ACR Breast Density Category b: There are scattered areas of
fibroglandular density.
FINDINGS: In the right breast, a possible asymmetry warrants further
evaluation. In the left breast, no findings suspicious for
malignancy.
IMPRESSION: Further evaluation is suggested for possible asymmetry in the right
breast.

RECOMMENDATION:
Diagnostic mammogram and possibly ultrasound of the right breast.
(Code:[id number])

BI-RADS CATEGORY  0: Incomplete. Need additional imaging evaluation
and/or prior mammograms for comparison.

## 2023-02-15 ENCOUNTER — Encounter: Payer: Self-pay | Admitting: Obstetrics and Gynecology

## 2023-02-15 ENCOUNTER — Ambulatory Visit (INDEPENDENT_AMBULATORY_CARE_PROVIDER_SITE_OTHER): Payer: Managed Care, Other (non HMO) | Admitting: Obstetrics and Gynecology

## 2023-02-15 VITALS — BP 124/66 | Ht 64.25 in | Wt 179.0 lb

## 2023-02-15 DIAGNOSIS — Z79890 Hormone replacement therapy (postmenopausal): Secondary | ICD-10-CM

## 2023-02-15 DIAGNOSIS — F411 Generalized anxiety disorder: Secondary | ICD-10-CM | POA: Insufficient documentation

## 2023-02-15 DIAGNOSIS — Z01419 Encounter for gynecological examination (general) (routine) without abnormal findings: Secondary | ICD-10-CM | POA: Insufficient documentation

## 2023-02-15 MED ORDER — ESTRADIOL 0.5 MG PO TABS: 0.5000 mg | ORAL_TABLET | Freq: Every day | ORAL | 3 refills | Status: DC

## 2023-02-15 NOTE — Patient Instructions (Signed)
For patients under 50-61yo, I recommend 1200mg  calcium daily and 600IU of vitamin D daily. For patients over 61yo, I recommend 1200mg  calcium daily and 800IU of vitamin D daily.  Health Maintenance, Female Adopting a healthy lifestyle and getting preventive care are important in promoting health and wellness. Ask your health care provider about: The right schedule for you to have regular tests and exams. Things you can do on your own to prevent diseases and keep yourself healthy. What should I know about diet, weight, and exercise? Eat a healthy diet  Eat a diet that includes plenty of vegetables, fruits, low-fat dairy products, and lean protein. Do not eat a lot of foods that are high in solid fats, added sugars, or sodium. Maintain a healthy weight Body mass index (BMI) is used to identify weight problems. It estimates body fat based on height and weight. Your health care provider can help determine your BMI and help you achieve or maintain a healthy weight. Get regular exercise Get regular exercise. This is one of the most important things you can do for your health. Most adults should: Exercise for at least 150 minutes each week. The exercise should increase your heart rate and make you sweat (moderate-intensity exercise). Do strengthening exercises at least twice a week. This is in addition to the moderate-intensity exercise. Spend less time sitting. Even light physical activity can be beneficial. Watch cholesterol and blood lipids Have your blood tested for lipids and cholesterol at 61 years of age, then have this test every 5 years. Have your cholesterol levels checked more often if: Your lipid or cholesterol levels are high. You are older than 61 years of age. You are at high risk for heart disease. What should I know about cancer screening? Depending on your health history and family history, you may need to have cancer screening at various ages. This may include screening  for: Breast cancer. Cervical cancer. Colorectal cancer. Skin cancer. Lung cancer. What should I know about heart disease, diabetes, and high blood pressure? Blood pressure and heart disease High blood pressure causes heart disease and increases the risk of stroke. This is more likely to develop in people who have high blood pressure readings or are overweight. Have your blood pressure checked: Every 3-5 years if you are 83-61 years of age. Every year if you are 4 years old or older. Diabetes Have regular diabetes screenings. This checks your fasting blood sugar level. Have the screening done: Once every three years after age 68 if you are at a normal weight and have a low risk for diabetes. More often and at a younger age if you are overweight or have a high risk for diabetes. What should I know about preventing infection? Hepatitis B If you have a higher risk for hepatitis B, you should be screened for this virus. Talk with your health care provider to find out if you are at risk for hepatitis B infection. Hepatitis C Testing is recommended for: Everyone born from [birth year] through 1964. Anyone with known risk factors for hepatitis C. Sexually transmitted infections (STIs) Get screened for STIs, including gonorrhea and chlamydia, if: You are sexually active and are younger than 61 years of age. You are older than 61 years of age and your health care provider tells you that you are at risk for this type of infection. Your sexual activity has changed since you were last screened, and you are at increased risk for chlamydia or gonorrhea. Ask your health care provider if  you are at risk. Ask your health care provider about whether you are at high risk for HIV. Your health care provider may recommend a prescription medicine to help prevent HIV infection. If you choose to take medicine to prevent HIV, you should first get tested for HIV. You should then be tested every 3 months for as long as you  are taking the medicine. Osteoporosis and menopause Osteoporosis is a disease in which the bones lose minerals and strength with aging. This can result in bone fractures. If you are 44 years old or older, or if you are at risk for osteoporosis and fractures, ask your health care provider if you should: Be screened for bone loss. Take a calcium or vitamin D supplement to lower your risk of fractures. Be given hormone replacement therapy (HRT) to treat symptoms of menopause. Follow these instructions at home: Alcohol use Do not drink alcohol if: Your health care provider tells you not to drink. You are pregnant, may be pregnant, or are planning to become pregnant. If you drink alcohol: Limit how much you have to: 0-1 drink a day. Know how much alcohol is in your drink. In the [place] one drink equals one 12 oz bottle of beer (355 mL), one 5 oz glass of wine (148 mL), or one 1 oz glass of hard liquor (44 mL). Lifestyle Do not use any products that contain nicotine or tobacco. These products include cigarettes, chewing tobacco, and vaping devices, such as e-cigarettes. If you need help quitting, ask your health care provider. Do not use street drugs. Do not share needles. Ask your health care provider for help if you need support or information about quitting drugs. General instructions Schedule regular health, dental, and eye exams. Stay current with your vaccines. Tell your health care provider if: You often feel depressed. You have ever been abused or do not feel safe at home. Summary Adopting a healthy lifestyle and getting preventive care are important in promoting health and wellness. Follow your health care provider's instructions about healthy diet, exercising, and getting tested or screened for diseases. Follow your health care provider's instructions on monitoring your cholesterol and blood pressure. This information is not intended to replace advice given to you by your health  care provider. Make sure you discuss any questions you have with your health care provider. Document Revised: 09/01/2020 Document Reviewed: 09/01/2020 Elsevier Patient Education  2024 [employer].

## 2023-02-15 NOTE — Assessment & Plan Note (Signed)
Recommend taper of PO estrogen given age, will decreased from 1mg  every day to 0.5mg  every day

## 2023-02-15 NOTE — Assessment & Plan Note (Signed)
Cervical cancer screening performed according to ASCCP guidelines. Encouraged annual mammogram screening Colonoscopy UTD DXA repeat at age 61 or sooner if stops HRT Labs and immunizations with her primary Encouraged safe sexual practices as indicated Encouraged healthy lifestyle practices with diet and exercise For patients under 50-70yo, I recommend 1200mg  calcium daily and 600IU of vitamin D daily.

## 2023-02-15 NOTE — Progress Notes (Signed)
63 y.o. 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  Patient's last menstrual period was 02/27/2016 (lmp unknown).   Abnormal bleeding: none Pelvic discharge or pain: none Breast mass, nipple discharge or skin changes : none Last PAP: not indicated Last mammogram: Nov 2023 Last colonoscopy: 09/26/15, q10y DXA: 10/22/2009 Sexually active: no recent sexual intercourse 2/2 husband's ED Exercising: Caring for 2yo granddaughter and mom  GYN HISTORY: Hx of TAH, BSO CIN 1, s/p ablation 1989  OB History  Gravida Para Term Preterm AB Living  1 1 1     1   SAB IAB Ectopic Multiple Live Births               # Outcome Date GA Lbr Len/2nd Weight Sex Type Anes PTL Lv  1 Term             Past Medical History:  Diagnosis Date   Anemia    Asthma    Hx childhood - no inhaler, no problems as adult   Blood transfusion without reported diagnosis    HSV (herpes simplex virus) anogenital infection 10/2017   Hypertension     Past Surgical History:  Procedure Laterality Date   ABDOMINAL HYSTERECTOMY N/A 07/13/2016   Procedure: HYSTERECTOMY ABDOMINAL;  Surgeon: Dara Lords, MD;  Location: [facility];  Service: Gynecology;  Laterality: N/A;  Request 7:30am OR time  Request 2 hours OR time  Joslyn Devon will be here for the open vessel sealer   BREAST BIOPSY Right 04/12/2013   BREAST SURGERY     RIGHT BREAST LUMP; BREAST REDUCTION IN 2006    COLONOSCOPY     LASER ABLATION OF THE CERVIX  1989   CIN-1   REDUCTION MAMMAPLASTY Bilateral    SALPINGOOPHORECTOMY Bilateral 07/13/2016   Procedure: SALPINGO OOPHORECTOMY;  Surgeon: Dara Lords, MD;  Location: [facility];  Service: Gynecology;  Laterality: Bilateral;   TUBAL LIGATION  1989    Current Outpatient Medications on File Prior to Visit  Medication Sig Dispense Refill   ALPRAZolam (XANAX) 0.25 MG tablet Take 0.25 mg by mouth 3 (three) times daily  as needed for anxiety.   0   amphetamine-dextroamphetamine (ADDERALL XR) 10 MG 24 hr capsule Take 10 mg by mouth every morning.     desvenlafaxine (PRISTIQ) 100 MG 24 hr tablet Take 100 mg by mouth daily.     ibuprofen (ADVIL,MOTRIN) 200 MG tablet Take 400 mg by mouth every 6 (six) hours as needed.     Multiple Vitamin (MULTIVITAMIN PO) Take 1 tablet by mouth daily.     valACYclovir (VALTREX) 500 MG tablet TAKE 1 TABLET BY MOUTH TWICE DAILY FOR 3 DAYS WITH AN OUTBREAK 30 tablet 1   valsartan-hydrochlorothiazide (DIOVAN-HCT) 160-12.5 MG tablet Take 1 tablet by mouth daily.  11   No current facility-administered medications on file prior to visit.    Social History   Socioeconomic History   Marital status: Married    Spouse name: Not on file   Number of children: Not on file   Years of education: Not on file   Highest education level: Not on file  Occupational History   Not on file  Tobacco Use   Smoking status: Never    Passive exposure: Never   Smokeless tobacco: Never  Vaping Use   Vaping status: Never Used  Substance and Sexual Activity   Alcohol use: No    Alcohol/week: 0.0 standard drinks  of alcohol   Drug use: No   Sexual activity: Yes    Partners: Male    Birth control/protection: Surgical    Comment: hysterectomy  Other Topics Concern   Not on file  Social History Narrative   Not on file   Social Determinants of Health   Financial Resource Strain: Not on file  Food Insecurity: Not on file  Transportation Needs: Not on file  Physical Activity: Not on file  Stress: Not on file  Social Connections: Not on file  Intimate Partner Violence: Not on file    Family History  Problem Relation Age of Onset   Hypertension Mother    Hypertension Father    Heart disease Father    Hypertension Brother    Heart disease Brother    Cancer Maternal Aunt        Pancreatic   Cancer Maternal Aunt        Lung   Cancer Maternal Uncle        Lung   Alzheimer's disease  Paternal Grandmother    Colon cancer Neg Hx     No Known Allergies    PE Today's Vitals   02/15/23 1537  BP: 124/66  Weight: 179 lb (81.2 kg)  Height: 5' 4.25" (1.632 m)   Body mass index is 30.49 kg/m.  Physical Exam Vitals reviewed. Exam conducted with a chaperone present.  Constitutional:      General: She is not in acute distress.    Appearance: Normal appearance.  HENT:     Head: Normocephalic and atraumatic.     Nose: Nose normal.  Eyes:     Extraocular Movements: Extraocular movements intact.     Conjunctiva/sclera: Conjunctivae normal.  Neck:     Thyroid: No thyroid mass, thyromegaly or thyroid tenderness.  Pulmonary:     Effort: Pulmonary effort is normal.  Chest:     Chest wall: No mass or tenderness.  Breasts:    Right: Normal. No swelling, mass, nipple discharge or tenderness.     Left: Normal. No swelling, mass, nipple discharge or tenderness.     Comments: Bilateral breast reduction scars Abdominal:     General: There is no distension.     Palpations: Abdomen is soft.     Tenderness: There is no abdominal tenderness.  Genitourinary:    General: Normal vulva.     Exam position: Lithotomy position.     Urethra: No prolapse.     Vagina: Normal. No vaginal discharge or bleeding.     Cervix: No lesion.     Adnexa: Right adnexa normal and left adnexa normal.     Comments: Cervix and uterus absent Musculoskeletal:        General: Normal range of motion.     Cervical back: Normal range of motion.  Lymphadenopathy:     Upper Body:     Right upper body: No axillary adenopathy.     Left upper body: No axillary adenopathy.     Lower Body: No right inguinal adenopathy. No left inguinal adenopathy.  Skin:    General: Skin is warm and dry.  Neurological:     General: No focal deficit present.     Mental Status: She is alert.  Psychiatric:        Mood and Affect: Mood normal.        Behavior: Behavior normal.       Assessment and Plan:        Well  woman exam with routine gynecological exam  Assessment & Plan: Cervical cancer screening performed according to ASCCP guidelines. Encouraged annual mammogram screening Colonoscopy UTD DXA repeat at age 59 or sooner if stops HRT Labs and immunizations with her primary Encouraged safe sexual practices as indicated Encouraged healthy lifestyle practices with diet and exercise For patients under 50-70yo, I recommend 1200mg  calcium daily and 600IU of vitamin D daily.    Postmenopausal HRT (hormone replacement therapy) Assessment & Plan: Recommend taper of PO estrogen given age, will decreased from 1mg  every day to 0.5mg  every day   Orders: -     Estradiol; Take 1 tablet (0.5 mg total) by mouth daily.  Dispense: 90 tablet; Refill: 3    Rosalyn Gess, MD

## 2023-07-14 ENCOUNTER — Ambulatory Visit
Admission: RE | Admit: 2023-07-14 | Discharge: 2023-07-14 | Disposition: A | Discharge status: Home / Self Care | Source: Ambulatory Visit

## 2023-07-14 VITALS — BP 158/80 | HR 74 | Temp 97.5°F | Resp 18 | Ht 64.0 in | Wt 179.0 lb

## 2023-07-14 DIAGNOSIS — M79631 Pain in right forearm: Secondary | ICD-10-CM | POA: Diagnosis not present

## 2023-07-14 DIAGNOSIS — S8991XA Unspecified injury of right lower leg, initial encounter: Secondary | ICD-10-CM

## 2023-07-14 DIAGNOSIS — M79661 Pain in right lower leg: Secondary | ICD-10-CM

## 2023-07-14 DIAGNOSIS — I10 Essential (primary) hypertension: Secondary | ICD-10-CM | POA: Insufficient documentation

## 2023-07-14 DIAGNOSIS — E782 Mixed hyperlipidemia: Secondary | ICD-10-CM | POA: Insufficient documentation

## 2023-07-14 MED ORDER — PREDNISONE 20 MG PO TABS: 40.0000 mg | ORAL_TABLET | Freq: Every day | ORAL | 0 refills | Status: AC

## 2023-07-14 NOTE — ED Triage Notes (Signed)
 I fell and hurt leg and one week before the fall , my right knee got stiffed while sitting and when I got up my knee popped and it hurts .  Now it swells and hurt when walking around the house. I use ice and Tylenol for pain. I hit my lower right arm - Entered by patient  Additional details:   DOI: "06-30-2023 (Knee injury) and again 07-07-2023 (hurt knee/lower right foot and right arm when falling)". Not seen anywhere for this. No laceration. No LOC.

## 2023-07-18 NOTE — ED Provider Notes (Signed)
 [facility]    CSN: [id number] Arrival date & time: 07/14/23  4098      History   Chief Complaint Chief Complaint  Patient presents with   Leg Injury    HPI [facility] is a 63 y.o. female.   Patient here today for evaluation of injury to right knee as well as pain in her right arm and right foot.  She notes she had fallen about a week ago and hurt her right knee and she reports that re injured her knee when she got up to stand after feeling very stiff.  She feels as if she has had some swelling in her knee despite use of ice and Tylenol.  She also hit her right arm when she fell.  She has had mild pain there, she denies any head injury or loss of consciousness with fall.  The history is provided by the patient.    Past Medical History:  Diagnosis Date   Anemia    Asthma    Hx childhood - no inhaler, no problems as adult   Blood transfusion without reported diagnosis    HSV (herpes simplex virus) anogenital infection 10/2017   Hypertension     Patient Active Problem List   Diagnosis Date Noted   Mixed hyperlipidemia 07/14/2023   Essential hypertension 07/14/2023   GAD (generalized anxiety disorder) 02/15/2023   Well woman exam with routine gynecological exam 02/15/2023   Leiomyoma 07/13/2016   BMI 30.0-30.9,adult 01/13/2015   Axillary mass 04/05/2013   Ovarian cyst, right 04/04/2013   History of cervical dysplasia 01/16/2013   Fibroid uterus 01/16/2013   Postmenopausal HRT (hormone replacement therapy) 01/16/2013    Past Surgical History:  Procedure Laterality Date   ABDOMINAL HYSTERECTOMY N/A 07/13/2016   Procedure: HYSTERECTOMY ABDOMINAL;  Surgeon: Dara Lords, MD;  Location: [facility];  Service: Gynecology;  Laterality: N/A;  Request 7:30am OR time  Request 2 hours OR time  Joslyn Devon will be here for the open vessel sealer   BREAST BIOPSY Right 04/12/2013   BREAST SURGERY     RIGHT BREAST LUMP; BREAST REDUCTION IN 2006     COLONOSCOPY     LASER ABLATION OF THE CERVIX  1989   CIN-1   REDUCTION MAMMAPLASTY Bilateral    SALPINGOOPHORECTOMY Bilateral 07/13/2016   Procedure: SALPINGO OOPHORECTOMY;  Surgeon: Dara Lords, MD;  Location: [facility];  Service: Gynecology;  Laterality: Bilateral;   TUBAL LIGATION  1989    OB History     Gravida  1   Para  1   Term  1   Preterm      AB      Living  1      SAB      IAB      Ectopic      Multiple      Live Births               Home Medications    Prior to Admission medications   Medication Sig Start Date End Date Taking? Authorizing Provider  fluocinonide (LIDEX) 0.05 % external solution Apply 1 Application topically 2 (two) times daily. 06/20/23  Yes [provider]  predniSONE (DELTASONE) 20 MG tablet Take 2 tablets (40 mg total) by mouth daily with breakfast for 5 days. 07/14/23 07/19/23 Yes Tomi Bamberger, PA-C  TACLONEX external suspension Apply topically daily as needed. 04/26/23  Yes [provider]  ALPRAZolam Prudy Feeler) 0.25 MG tablet Take 0.25 mg by  mouth 3 (three) times daily as needed for anxiety.  03/25/16  Yes [provider]  amphetamine-dextroamphetamine (ADDERALL XR) 10 MG 24 hr capsule Take 10 mg by mouth every morning. 06/16/20   [provider]  desvenlafaxine (PRISTIQ) 100 MG 24 hr tablet Take 100 mg by mouth daily.   Yes [provider]  estradiol (ESTRACE) 0.5 MG tablet Take 1 tablet (0.5 mg total) by mouth daily. 02/15/23  Yes Hines, Genesis V, MD  ibuprofen (ADVIL,MOTRIN) 200 MG tablet Take 400 mg by mouth every 6 (six) hours as needed.    [provider]  Multiple Vitamin (MULTIVITAMIN PO) Take 1 tablet by mouth daily.    [provider]  terbinafine (LAMISIL) 250 MG tablet Take 250 mg by mouth daily.    [provider]  valACYclovir (VALTREX) 500 MG tablet TAKE 1 TABLET BY MOUTH TWICE DAILY FOR 3 DAYS WITH AN OUTBREAK 07/08/22   Romualdo Bolk, MD  valsartan-hydrochlorothiazide (DIOVAN-HCT) 160-12.5 MG tablet Take 1 tablet by mouth daily. Taken Higher dose now. 05/06/16  Yes [provider]    Family History Family History  Problem Relation Age of Onset   Hypertension Mother    Hypertension Father    Heart disease Father    Hypertension Brother    Heart disease Brother    Cancer Maternal Aunt        Pancreatic   Cancer Maternal Aunt        Lung   Cancer Maternal Uncle        Lung   Alzheimer's disease Paternal Grandmother    Colon cancer Neg Hx     Social History Social History   Tobacco Use   Smoking status: Never    Passive exposure: Never   Smokeless tobacco: Never  Vaping Use   Vaping status: Never Used  Substance Use Topics   Alcohol use: Not Currently   Drug use: Never     Allergies   Patient has no known allergies.   Review of Systems Review of Systems  Constitutional:  Negative for chills and fever.  Eyes:  Negative for discharge and redness.  Respiratory:  Negative for shortness of breath.   Gastrointestinal:  Negative for abdominal pain, nausea and vomiting.  Musculoskeletal:  Positive for arthralgias and joint swelling.  Neurological:  Negative for numbness.     Physical Exam Triage Vital Signs ED Triage Vitals  Encounter Vitals Group     BP 07/14/23 1011 (!) 158/80     Systolic BP Percentile --      Diastolic BP Percentile --      Pulse Rate 07/14/23 1011 74     Resp 07/14/23 1011 18     Temp 07/14/23 1011 (!) 97.5 F (36.4 C)     Temp Source 07/14/23 1011 Oral     SpO2 07/14/23 1011 98 %     Weight 07/14/23 1008 179 lb 0.2 oz (81.2 kg)     Height 07/14/23 1008 5\' 4"  (1.626 m)     Head Circumference --      Peak Flow --      Pain Score 07/14/23 1004 3     Pain Loc --      Pain Education --      Exclude from Growth Chart --    No data found.  Updated Vital Signs BP (!) 158/80 (BP Location: Left Arm)   Pulse 74   Temp (!) 97.5 F (36.4 C) (Oral)    Resp 18  Ht 5\' 4"  (1.626 m)   Wt 179 lb 0.2 oz (81.2 kg)   LMP 02/27/2016 (LMP Unknown)   SpO2 98%   BMI 30.73 kg/m   Visual Acuity Right Eye Distance:   Left Eye Distance:   Bilateral Distance:    Right Eye Near:   Left Eye Near:    Bilateral Near:     Physical Exam Vitals and nursing note reviewed.  Constitutional:      General: She is not in acute distress.    Appearance: Normal appearance. She is not ill-appearing.  HENT:     Head: Normocephalic and atraumatic.  Eyes:     Conjunctiva/sclera: Conjunctivae normal.  Cardiovascular:     Rate and Rhythm: Normal rate.  Pulmonary:     Effort: Pulmonary effort is normal. No respiratory distress.  Musculoskeletal:     Comments: Mildly decreased range of motion of right knee due to pain.  Minimal swelling of right knee noted.  No swelling appreciated to right arm   Neurological:     Mental Status: She is alert.  Psychiatric:        Mood and Affect: Mood normal.        Behavior: Behavior normal.        Thought Content: Thought content normal.      UC Treatments / Results  Labs (all labs ordered are listed, but only abnormal results are displayed) Labs Reviewed - No data to display  EKG   Radiology No results found.  Procedures Procedures (including critical care time)  Medications Ordered in UC Medications - No data to display  Initial Impression / Assessment and Plan / UC Course  I have reviewed the triage vital signs and the nursing notes.  Pertinent labs & imaging results that were available during my care of the patient were reviewed by me and considered in my medical decision making (see chart for details).    Suspect likely inflammatory cause of pain and will treat with steroid burst but strongly encouraged further evaluation by Ortho.  Patient expresses understanding.  Imaging deferred today.  Final Clinical Impressions(s) / UC Diagnoses   Final diagnoses:  Right knee injury, initial encounter   Pain in right lower leg  Right forearm pain   Discharge Instructions   None    ED Prescriptions     Medication Sig Dispense Auth. Provider   predniSONE (DELTASONE) 20 MG tablet Take 2 tablets (40 mg total) by mouth daily with breakfast for 5 days. 10 tablet Tomi Bamberger, PA-C      PDMP not reviewed this encounter.   Tomi Bamberger, PA-C 07/18/23 1006

## 2023-08-19 ENCOUNTER — Encounter (HOSPITAL_BASED_OUTPATIENT_CLINIC_OR_DEPARTMENT_OTHER): Payer: Self-pay | Admitting: Student

## 2023-08-19 ENCOUNTER — Ambulatory Visit (HOSPITAL_BASED_OUTPATIENT_CLINIC_OR_DEPARTMENT_OTHER): Admitting: Student

## 2023-08-19 ENCOUNTER — Ambulatory Visit (HOSPITAL_BASED_OUTPATIENT_CLINIC_OR_DEPARTMENT_OTHER)

## 2023-08-19 DIAGNOSIS — M25561 Pain in right knee: Secondary | ICD-10-CM | POA: Diagnosis not present

## 2023-08-19 MED ORDER — LIDOCAINE HCL 1 % IJ SOLN
4.0000 mL | INTRAMUSCULAR | Status: AC | PRN
  Administered 2023-08-19: 4 mL

## 2023-08-19 MED ORDER — TRIAMCINOLONE ACETONIDE 40 MG/ML IJ SUSP
2.0000 mL | INTRAMUSCULAR | Status: AC | PRN
  Administered 2023-08-19: 2 mL via INTRA_ARTICULAR

## 2023-08-19 NOTE — Progress Notes (Signed)
 Chief Complaint: Right knee pain    Discussed the use of AI scribe software for clinical note transcription with the patient, who gave verbal consent to proceed.  History of Present Illness The patient presents with right knee pain that began two months ago after a fall. Initially, the knee 'popped' while chasing her grandchild, leading to stiffness and excruciating pain. A week later, she slipped and fell again, re-injuring the same knee. She sought care at an urgent care center, where she was given prednisone , which provided temporary relief. However, the knee issue persists, with nightly swelling and pain. The patient suspects there may be fluid in the knee. The pain is primarily located in the inner part of the knee and is exacerbated by flexion. The patient also reports increased popping and clicking, particularly when going up and down stairs. The knee occasionally feels like it wants to 'give out.' The patient has been managing the pain with Tylenol  and by elevating the knee when possible.   Surgical History:   None  PMH/PSH/Family History/Social History/Meds/Allergies:    Past Medical History:  Diagnosis Date   Anemia    Asthma    Hx childhood - no inhaler, no problems as adult   Blood transfusion without reported diagnosis    HSV (herpes simplex virus) anogenital infection 10/2017   Hypertension    Past Surgical History:  Procedure Laterality Date   ABDOMINAL HYSTERECTOMY N/A 07/13/2016   Procedure: HYSTERECTOMY ABDOMINAL;  Surgeon: Lacretia Piccolo, MD;  Location: [facility];  Service: Gynecology;  Laterality: N/A;  Request 7:30am OR time  Request 2 hours OR time  Jammie Mccune will be here for the open vessel sealer   BREAST BIOPSY Right 04/12/2013   BREAST SURGERY     RIGHT BREAST LUMP; BREAST REDUCTION IN 2006    COLONOSCOPY     LASER ABLATION OF THE CERVIX  1989   CIN-1   REDUCTION MAMMAPLASTY Bilateral    SALPINGOOPHORECTOMY  Bilateral 07/13/2016   Procedure: SALPINGO OOPHORECTOMY;  Surgeon: Lacretia Piccolo, MD;  Location: [facility];  Service: Gynecology;  Laterality: Bilateral;   TUBAL LIGATION  1989   Social History   Socioeconomic History   Marital status: Married    Spouse name: Not on file   Number of children: Not on file   Years of education: Not on file   Highest education level: Not on file  Occupational History   Not on file  Tobacco Use   Smoking status: Never    Passive exposure: Never   Smokeless tobacco: Never  Vaping Use   Vaping status: Never Used  Substance and Sexual Activity   Alcohol use: Not Currently   Drug use: Never   Sexual activity: Yes    Partners: Male    Birth control/protection: Surgical    Comment: hysterectomy  Other Topics Concern   Not on file  Social History Narrative   Not on file   Social Drivers of Health   Financial Resource Strain: Not on file  Food Insecurity: Not on file  Transportation Needs: Not on file  Physical Activity: Not on file  Stress: Not on file  Social Connections: Not on file   Family History  Problem Relation Age of Onset   Hypertension Mother    Hypertension Father    Heart disease Father  Hypertension Brother    Heart disease Brother    Cancer Maternal Aunt        Pancreatic   Cancer Maternal Aunt        Lung   Cancer Maternal Uncle        Lung   Alzheimer's disease Paternal Grandmother    Colon cancer Neg Hx    No Known Allergies Current Outpatient Medications  Medication Sig Dispense Refill   ALPRAZolam (XANAX) 0.25 MG tablet Take 0.25 mg by mouth 3 (three) times daily as needed for anxiety.   0   amphetamine-dextroamphetamine (ADDERALL XR) 10 MG 24 hr capsule Take 10 mg by mouth every morning.     desvenlafaxine  (PRISTIQ ) 100 MG 24 hr tablet Take 100 mg by mouth daily.     estradiol  (ESTRACE ) 0.5 MG tablet Take 1 tablet (0.5 mg total) by mouth daily. 90 tablet 3   fluocinonide (LIDEX) 0.05 % external solution  Apply 1 Application topically 2 (two) times daily.     ibuprofen (ADVIL,MOTRIN) 200 MG tablet Take 400 mg by mouth every 6 (six) hours as needed.     Multiple Vitamin (MULTIVITAMIN PO) Take 1 tablet by mouth daily.     TACLONEX external suspension Apply topically daily as needed.     terbinafine (LAMISIL) 250 MG tablet Take 250 mg by mouth daily.     valACYclovir  (VALTREX ) 500 MG tablet TAKE 1 TABLET BY MOUTH TWICE DAILY FOR 3 DAYS WITH AN OUTBREAK 30 tablet 1   valsartan -hydrochlorothiazide  (DIOVAN -HCT) 160-12.5 MG tablet Take 1 tablet by mouth daily. Taken Higher dose now.  11   No current facility-administered medications for this visit.   No results found.  Review of Systems:   A ROS was performed including pertinent positives and negatives as documented in the HPI.  Physical Exam :   Constitutional: NAD and appears stated age Neurological: Alert and oriented Psych: Appropriate affect and cooperative Last menstrual period 02/27/2016.   Comprehensive Musculoskeletal Exam:    Right knee exam demonstrates significant tenderness palpation over the medial joint line.  Active range of motion from 5 to 110 degrees without crepitus.  Mild effusion is present without overlying erythema.  No laxity with varus or valgus stress.  Positive medial McMurray.  Imaging:   Xray (right knee 4 views): No evidence of acute bony abnormality.  Joint spaces are well-maintained in all 3 compartments.   I personally reviewed and interpreted the radiographs.      Assessment & Plan Right knee pain with possible meniscus injury   Acute right knee pain persists post-fall months ago, accompanied by medial pain, swelling, and mechanical symptoms. X-rays are normal, but a possible meniscus tear is suspected. An MRI may be necessary for a definitive diagnosis. A cortisone injection is considered for both relief and diagnostic insight. Administer a cortisone injection to the right knee and order an MRI.  Provide contact information for follow-up after the MRI.     Procedure Note  Patient: [facility]             Date of Birth: [birth year]           MRN: [id number]             Visit Date: 08/19/2023  Procedures: Visit Diagnoses:  1. Acute pain of right knee     Large Joint Inj: R knee on 08/19/2023 3:54 PM Indications: pain Details: 22 G 1.5 in needle, anterolateral approach Medications: 4 mL lidocaine  1 %; 2 mL triamcinolone  acetonide 40 MG/ML Outcome: tolerated well, no immediate complications Procedure, treatment alternatives, risks and benefits explained, specific risks discussed. Consent was given by the patient. Immediately prior to procedure a time out was called to verify the correct patient, procedure, equipment, support staff and site/side marked as required. Patient was prepped and draped in the usual sterile fashion.       I personally saw and evaluated the patient, and participated in the management and treatment plan.  Sharrell Deck, PA-C Orthopedics

## 2023-09-06 ENCOUNTER — Ambulatory Visit
Admission: RE | Admit: 2023-09-06 | Discharge: 2023-09-06 | Disposition: A | Discharge status: Home / Self Care | Source: Ambulatory Visit | Attending: Student

## 2023-09-06 DIAGNOSIS — M25561 Pain in right knee: Secondary | ICD-10-CM

## 2023-09-14 ENCOUNTER — Encounter (HOSPITAL_BASED_OUTPATIENT_CLINIC_OR_DEPARTMENT_OTHER): Payer: Self-pay | Admitting: Student

## 2023-09-14 ENCOUNTER — Ambulatory Visit (INDEPENDENT_AMBULATORY_CARE_PROVIDER_SITE_OTHER): Admitting: Student

## 2023-09-14 DIAGNOSIS — M25561 Pain in right knee: Secondary | ICD-10-CM

## 2023-09-14 MED ORDER — MELOXICAM 15 MG PO TABS: 15.0000 mg | ORAL_TABLET | Freq: Every day | ORAL | 0 refills | Status: AC

## 2023-09-14 NOTE — Progress Notes (Signed)
 Chief Complaint: Right knee pain    History of Present Illness  09/14/23: Patient presents clinic today for follow-up evaluation and MRI review of the right knee.  States that cortisone injection at last visit did give her some good temporary relief, however symptoms have somewhat returned.  She does continue to report pain on the medial side of the knee as well as stiffness with periods of inactivity.  She does experience increased pain with long periods of standing and going up or down stairs.  Has not been taking any medication recently.   08/19/23:The patient presents with right knee pain that began two months ago after a fall. Initially, the knee 'popped' while chasing her grandchild, leading to stiffness and excruciating pain. A week later, she slipped and fell again, re-injuring the same knee. She sought care at an urgent care center, where she was given prednisone , which provided temporary relief. However, the knee issue persists, with nightly swelling and pain. The patient suspects there may be fluid in the knee. The pain is primarily located in the inner part of the knee and is exacerbated by flexion. The patient also reports increased popping and clicking, particularly when going up and down stairs. The knee occasionally feels like it wants to 'give out.' The patient has been managing the pain with Tylenol  and by elevating the knee when possible.   Surgical History:   None  PMH/PSH/Family History/Social History/Meds/Allergies:    Past Medical History:  Diagnosis Date   Anemia    Asthma    Hx childhood - no inhaler, no problems as adult   Blood transfusion without reported diagnosis    HSV (herpes simplex virus) anogenital infection 10/2017   Hypertension    Past Surgical History:  Procedure Laterality Date   ABDOMINAL HYSTERECTOMY N/A 07/13/2016   Procedure: HYSTERECTOMY ABDOMINAL;  Surgeon: Lacretia Piccolo, MD;  Location: [facility];  Service:  Gynecology;  Laterality: N/A;  Request 7:30am OR time  Request 2 hours OR time  Jammie Mccune will be here for the open vessel sealer   BREAST BIOPSY Right 04/12/2013   BREAST SURGERY     RIGHT BREAST LUMP; BREAST REDUCTION IN 2006    COLONOSCOPY     LASER ABLATION OF THE CERVIX  1989   CIN-1   REDUCTION MAMMAPLASTY Bilateral    SALPINGOOPHORECTOMY Bilateral 07/13/2016   Procedure: SALPINGO OOPHORECTOMY;  Surgeon: Lacretia Piccolo, MD;  Location: [facility];  Service: Gynecology;  Laterality: Bilateral;   TUBAL LIGATION  1989   Social History   Socioeconomic History   Marital status: Married    Spouse name: Not on file   Number of children: Not on file   Years of education: Not on file   Highest education level: Not on file  Occupational History   Not on file  Tobacco Use   Smoking status: Never    Passive exposure: Never   Smokeless tobacco: Never  Vaping Use   Vaping status: Never Used  Substance and Sexual Activity   Alcohol use: Not Currently   Drug use: Never   Sexual activity: Yes    Partners: Male    Birth control/protection: Surgical    Comment: hysterectomy  Other Topics Concern   Not on file  Social History Narrative   Not on file   Social Drivers  of Health   Financial Resource Strain: Not on file  Food Insecurity: Not on file  Transportation Needs: Not on file  Physical Activity: Not on file  Stress: Not on file  Social Connections: Not on file   Family History  Problem Relation Age of Onset   Hypertension Mother    Hypertension Father    Heart disease Father    Hypertension Brother    Heart disease Brother    Cancer Maternal Aunt        Pancreatic   Cancer Maternal Aunt        Lung   Cancer Maternal Uncle        Lung   Alzheimer's disease Paternal Grandmother    Colon cancer Neg Hx    No Known Allergies Current Outpatient Medications  Medication Sig Dispense Refill   meloxicam (MOBIC) 15 MG tablet Take 1 tablet (15 mg total) by mouth  daily for 14 days. 14 tablet 0   ALPRAZolam (XANAX) 0.25 MG tablet Take 0.25 mg by mouth 3 (three) times daily as needed for anxiety.   0   amphetamine-dextroamphetamine (ADDERALL XR) 10 MG 24 hr capsule Take 10 mg by mouth every morning.     desvenlafaxine  (PRISTIQ ) 100 MG 24 hr tablet Take 100 mg by mouth daily.     estradiol  (ESTRACE ) 0.5 MG tablet Take 1 tablet (0.5 mg total) by mouth daily. 90 tablet 3   fluocinonide (LIDEX) 0.05 % external solution Apply 1 Application topically 2 (two) times daily.     ibuprofen (ADVIL,MOTRIN) 200 MG tablet Take 400 mg by mouth every 6 (six) hours as needed.     Multiple Vitamin (MULTIVITAMIN PO) Take 1 tablet by mouth daily.     TACLONEX external suspension Apply topically daily as needed.     terbinafine (LAMISIL) 250 MG tablet Take 250 mg by mouth daily.     valACYclovir  (VALTREX ) 500 MG tablet TAKE 1 TABLET BY MOUTH TWICE DAILY FOR 3 DAYS WITH AN OUTBREAK 30 tablet 1   valsartan -hydrochlorothiazide  (DIOVAN -HCT) 160-12.5 MG tablet Take 1 tablet by mouth daily. Taken Higher dose now.  11   No current facility-administered medications for this visit.   No results found.  Review of Systems:   A ROS was performed including pertinent positives and negatives as documented in the HPI.  Physical Exam :   Constitutional: NAD and appears stated age Neurological: Alert and oriented Psych: Appropriate affect and cooperative Last menstrual period 02/27/2016.   Comprehensive Musculoskeletal Exam:    Exam of the right knee continues to demonstrate tenderness along the medial joint line.  Active range of motion is from 0 to 120 degrees.  Minimal to mild swelling and effusion present.  No laxity with varus or valgus stress, with valgus eliciting slight discomfort.  Imaging:   MRI right knee: No evidence of meniscal or ligamentous injury.  Mild tricompartmental degenerative changes.  Small joint effusion present.   I personally reviewed and interpreted the  radiographs.      Assessment & Plan Acute right knee pain Patient does continue to experience medial sided right knee pain originating from a fall approximately 3 months ago.  Review of recent MRI today does demonstrate some mild tricompartmental degenerative changes, although no evidence of meniscal or ligamentous injury.  She did get good relief with previous cortisone injection, although this only lasted for a few weeks.  Discussed that given her overall well-appearing MRI her outlook concerning the knee is good, however I will plan to  start her on a course of meloxicam today in order to continue to get the knee irritation down.  May consider HEP or formal physical therapy for strengthening if pain persists.     I personally saw and evaluated the patient, and participated in the management and treatment plan.  Sharrell Deck, PA-C Orthopedics

## 2023-11-16 ENCOUNTER — Other Ambulatory Visit: Payer: Self-pay

## 2023-11-16 NOTE — Telephone Encounter (Signed)
 Medication refill request: valtrex  500mg  Last AEX:  02-15-23 Next AEX: not scheduled Last MMG (if hormonal medication request): n/a Refill authorized: please approve if appropriate

## 2023-11-17 MED ORDER — VALACYCLOVIR HCL 500 MG PO TABS: ORAL_TABLET | ORAL | 1 refills | Status: AC

## 2023-12-27 ENCOUNTER — Other Ambulatory Visit: Payer: Self-pay | Admitting: Obstetrics and Gynecology

## 2023-12-27 DIAGNOSIS — Z79890 Hormone replacement therapy (postmenopausal): Secondary | ICD-10-CM

## 2023-12-27 NOTE — Telephone Encounter (Signed)
 Med refill request: estrace  tablets  Last AEX: 02/15/23 Next AEX: not scheduled message sent to fd  Last MMG (if hormonal med) 03/11/22 birads cat 2 benign  Refill authorized: last rx 02/15/23 #90 with 3 refills. Please approve or deny

## 2024-02-27 ENCOUNTER — Encounter: Payer: Self-pay | Admitting: Radiology

## 2024-03-27 ENCOUNTER — Other Ambulatory Visit: Payer: Self-pay | Admitting: Obstetrics and Gynecology

## 2024-03-27 DIAGNOSIS — Z79890 Hormone replacement therapy (postmenopausal): Secondary | ICD-10-CM

## 2024-03-28 NOTE — Telephone Encounter (Signed)
 Med refill request:   estradiol  (ESTRACE ) 0.5 MG tablet  Start:  12/27/23 Disp:  90 tablets Refills:  0  Last AEX:  02/15/23 Next AEX:  Not yet scheduled *Front desk has been asked to contact Pt to schedule a visit. Last MMG (if hormonal med):  03/11/22 Refill authorized? Please Advise.

## 2024-04-20 ENCOUNTER — Other Ambulatory Visit: Payer: Self-pay | Admitting: Obstetrics and Gynecology

## 2024-04-20 DIAGNOSIS — Z79890 Hormone replacement therapy (postmenopausal): Secondary | ICD-10-CM

## 2024-04-23 NOTE — Telephone Encounter (Signed)
 Med refill request:   Estradiol  (ESTRACE ) 0.5 MG tablet  Start:  12/27/23 Disp:  90 tablets Refills:  0  Last AEX:  02/15/23 Next AEX:  05/31/24 Last MMG (if hormonal med):  03/11/22 Refill authorized? Please Advise.

## 2024-05-31 ENCOUNTER — Encounter: Payer: Self-pay | Admitting: Obstetrics and Gynecology

## 2024-05-31 ENCOUNTER — Ambulatory Visit (INDEPENDENT_AMBULATORY_CARE_PROVIDER_SITE_OTHER): Admitting: Obstetrics and Gynecology

## 2024-05-31 VITALS — BP 118/68 | HR 95 | Temp 98.9°F | Ht 65.25 in | Wt 175.0 lb

## 2024-05-31 DIAGNOSIS — Z1331 Encounter for screening for depression: Secondary | ICD-10-CM

## 2024-05-31 DIAGNOSIS — N644 Mastodynia: Secondary | ICD-10-CM | POA: Diagnosis not present

## 2024-05-31 DIAGNOSIS — Z79890 Hormone replacement therapy (postmenopausal): Secondary | ICD-10-CM

## 2024-05-31 DIAGNOSIS — R151 Fecal smearing: Secondary | ICD-10-CM | POA: Diagnosis not present

## 2024-05-31 DIAGNOSIS — Z01419 Encounter for gynecological examination (general) (routine) without abnormal findings: Secondary | ICD-10-CM

## 2024-05-31 DIAGNOSIS — N393 Stress incontinence (female) (male): Secondary | ICD-10-CM | POA: Insufficient documentation

## 2024-05-31 MED ORDER — ESTRADIOL 0.5 MG PO TABS: 0.5000 mg | ORAL_TABLET | Freq: Every day | ORAL | 4 refills | Status: AC

## 2024-05-31 NOTE — Assessment & Plan Note (Signed)
 Recommend increasing daily fiber Recommend pelvic floor PT however she does not feel she has time at this time Encouraged regular Kegel exercises, core exercises and daily squats Recommend increasing physical activity

## 2024-05-31 NOTE — Assessment & Plan Note (Signed)
 Cervical cancer screening: N/A Encouraged annual mammogram screening Colonoscopy UTD DXA repeat at age 63 or sooner if stops HRT Labs and immunizations with her primary Encouraged safe sexual practices as indicated Encouraged healthy lifestyle practices with diet and exercise For patients under 50-70yo, I recommend 1200mg  calcium daily and 600IU of vitamin D daily.

## 2024-05-31 NOTE — Patient Instructions (Signed)
 For patients under 50-63yo, I recommend 1200mg  calcium  daily and 600IU of vitamin D daily. For patients over 63yo, I recommend 1200mg  calcium  daily and 800IU of vitamin D daily.  Health Maintenance, Female Adopting a healthy lifestyle and getting preventive care are important in promoting health and wellness. Ask your health care provider about: The right schedule for you to have regular tests and exams. Things you can do on your own to prevent diseases and keep yourself healthy. What should I know about diet, weight, and exercise? Eat a healthy diet  Eat a diet that includes plenty of vegetables, fruits, low-fat dairy products, and lean protein. Do not eat a lot of foods that are high in solid fats, added sugars, or sodium. Maintain a healthy weight Body mass index (BMI) is used to identify weight problems. It estimates body fat based on height and weight. Your health care provider can help determine your BMI and help you achieve or maintain a healthy weight. Get regular exercise Get regular exercise. This is one of the most important things you can do for your health. Most adults should: Exercise for at least 150 minutes each week. The exercise should increase your heart rate and make you sweat (moderate-intensity exercise). Do strengthening exercises at least twice a week. This is in addition to the moderate-intensity exercise. Spend less time sitting. Even light physical activity can be beneficial. Watch cholesterol and blood lipids Have your blood tested for lipids and cholesterol at 63 years of age, then have this test every 5 years. Have your cholesterol levels checked more often if: Your lipid or cholesterol levels are high. You are older than 63 years of age. You are at high risk for heart disease. What should I know about cancer screening? Depending on your health history and family history, you may need to have cancer screening at various ages. This may include screening  for: Breast cancer. Cervical cancer. Colorectal cancer. Skin cancer. Lung cancer. What should I know about heart disease, diabetes, and high blood pressure? Blood pressure and heart disease High blood pressure causes heart disease and increases the risk of stroke. This is more likely to develop in people who have high blood pressure readings or are overweight. Have your blood pressure checked: Every 3-5 years if you are 25-57 years of age. Every year if you are 24 years old or older. Diabetes Have regular diabetes screenings. This checks your fasting blood sugar level. Have the screening done: Once every three years after age 62 if you are at a normal weight and have a low risk for diabetes. More often and at a younger age if you are overweight or have a high risk for diabetes. What should I know about preventing infection? Hepatitis B If you have a higher risk for hepatitis B, you should be screened for this virus. Talk with your health care provider to find out if you are at risk for hepatitis B infection. Hepatitis C Testing is recommended for: Everyone born from [birth year] through 1965. Anyone with known risk factors for hepatitis C. Sexually transmitted infections (STIs) Get screened for STIs, including gonorrhea and chlamydia, if: You are sexually active and are younger than 63 years of age. You are older than 63 years of age and your health care provider tells you that you are at risk for this type of infection. Your sexual activity has changed since you were last screened, and you are at increased risk for chlamydia or gonorrhea. Ask your health care provider if  you are at risk. Ask your health care provider about whether you are at high risk for HIV. Your health care provider may recommend a prescription medicine to help prevent HIV infection. If you choose to take medicine to prevent HIV, you should first get tested for HIV. You should then be tested every 3 months for as long as you  are taking the medicine. Osteoporosis and menopause Osteoporosis is a disease in which the bones lose minerals and strength with aging. This can result in bone fractures. If you are 72 years old or older, or if you are at risk for osteoporosis and fractures, ask your health care provider if you should: Be screened for bone loss. Take a calcium  or vitamin D supplement to lower your risk of fractures. Be given hormone replacement therapy (HRT) to treat symptoms of menopause. Follow these instructions at home: Alcohol use Do not drink alcohol if: Your health care provider tells you not to drink. You are pregnant, may be pregnant, or are planning to become pregnant. If you drink alcohol: Limit how much you have to: 0-1 drink a day. Know how much alcohol is in your drink. In the [place] one drink equals one 12 oz bottle of beer (355 mL), one 5 oz glass of wine (148 mL), or one 1 oz glass of hard liquor (44 mL). Lifestyle Do not use any products that contain nicotine or tobacco. These products include cigarettes, chewing tobacco, and vaping devices, such as e-cigarettes. If you need help quitting, ask your health care provider. Do not use street drugs. Do not share needles. Ask your health care provider for help if you need support or information about quitting drugs. General instructions Schedule regular health, dental, and eye exams. Stay current with your vaccines. Tell your health care provider if: You often feel depressed. You have ever been abused or do not feel safe at home. Summary Adopting a healthy lifestyle and getting preventive care are important in promoting health and wellness. Follow your health care provider's instructions about healthy diet, exercising, and getting tested or screened for diseases. Follow your health care provider's instructions on monitoring your cholesterol and blood pressure. This information is not intended to replace advice given to you by your health  care provider. Make sure you discuss any questions you have with your health care provider. Document Revised: 09/01/2020 Document Reviewed: 09/01/2020 Elsevier Patient Education  2024 [employer].

## 2024-05-31 NOTE — Progress Notes (Signed)
 "  64 y.o. [id number] female s/p hysterectomy, BSO (AUB-F) here for annual exam. Katelyn Salinas, now retired from call center. Married, husband had prostate cancer, ~45yr ago. PCP: Gerome Brunet, DO   Patient's last menstrual period was 02/27/2016.   She reports pain in left breast, comes and goes. She called to schedule MMG 2 wks ago but told to see Gyn first. She also reports worsening stress urinary incontinence and fecal smearing.  Wearing a panty liner daily.  No issues with constipation.  Caregiving for her mom, 35yo, and 3yo granddaughter who has autism. Husband currently in hospital due to severe, recurrent nosebleeds.  Abnormal bleeding: none Pelvic discharge or pain: none Breast mass, nipple discharge or skin changes : none  Last PAP: not indicated Last mammogram: Nov 2023 Last colonoscopy: 09/26/15, q10y DXA: 10/22/2009 wnl  Sexually active: no 2/2 husband's ED Exercising: no Smoker: no  Garment/textile Technologist Visit from 05/31/2024 in [facility]  PHQ-2 Total Score 0    GYN HISTORY: Hx of TAH, BSO CIN 1, s/p ablation 1989  OB History  Gravida Para Term Preterm AB Living  1 1 1   1   SAB IAB Ectopic Multiple Live Births      1    # Outcome Date GA Lbr Len/2nd Weight Sex Type Anes PTL Lv  1 Term      Vag-Spont   LIV    Past Medical History:  Diagnosis Date   Anemia    Asthma    Hx childhood - no inhaler, no problems as adult   Blood transfusion without reported diagnosis    Fibroid uterus 01/16/2013   HSV (herpes simplex virus) anogenital infection 10/2017   Hypertension    Ovarian cyst, right 04/04/2013    Past Surgical History:  Procedure Laterality Date   ABDOMINAL HYSTERECTOMY N/A 07/13/2016   Procedure: HYSTERECTOMY ABDOMINAL;  Surgeon: Evalene SHAUNNA Organ, MD;  Location: [facility];  Service: Gynecology;  Laterality: N/A;  Request 7:30am OR time  Request 2 hours OR time  Glendia Pais will be here for the open vessel sealer   BREAST  BIOPSY Right 04/12/2013   BREAST SURGERY     RIGHT BREAST LUMP; BREAST REDUCTION IN 2006    COLONOSCOPY     LASER ABLATION OF THE CERVIX  1989   CIN-1   REDUCTION MAMMAPLASTY Bilateral    SALPINGOOPHORECTOMY Bilateral 07/13/2016   Procedure: SALPINGO OOPHORECTOMY;  Surgeon: Evalene SHAUNNA Organ, MD;  Location: [facility];  Service: Gynecology;  Laterality: Bilateral;   TUBAL LIGATION  1989    Current Outpatient Medications on File Prior to Visit  Medication Sig Dispense Refill   amphetamine-dextroamphetamine (ADDERALL XR) 10 MG 24 hr capsule Take 10 mg by mouth every morning.     desvenlafaxine  (PRISTIQ ) 100 MG 24 hr tablet Take 100 mg by mouth daily.     fluocinonide (LIDEX) 0.05 % external solution Apply 1 Application topically 2 (two) times daily.     latanoprost  (XALATAN ) 0.005 % ophthalmic solution 1 drop at bedtime.     Multiple Vitamin (MULTIVITAMIN PO) Take 1 tablet by mouth daily.     TACLONEX external suspension Apply topically daily as needed.     timolol (TIMOPTIC) 0.25 % ophthalmic solution 1 drop every morning.     valACYclovir  (VALTREX ) 500 MG tablet TAKE 1 TABLET BY MOUTH TWICE DAILY for 3 days with an  OUTBREAK 30 tablet 1   valsartan -hydrochlorothiazide  (DIOVAN -HCT) 160-12.5 MG tablet Take 1 tablet by mouth daily. Taken  Higher dose now.  11   No current facility-administered medications on file prior to visit.    Social History   Socioeconomic History   Marital status: Married    Spouse name: Not on file   Number of children: Not on file   Years of education: Not on file   Highest education level: Not on file  Occupational History   Not on file  Tobacco Use   Smoking status: Never    Passive exposure: Never   Smokeless tobacco: Never  Vaping Use   Vaping status: Never Used  Substance and Sexual Activity   Alcohol use: Not Currently   Drug use: Never   Sexual activity: Yes    Partners: Male    Birth control/protection: Surgical    Comment: hysterectomy   Other Topics Concern   Not on file  Social History Narrative   Not on file   Social Drivers of Health   Tobacco Use: Low Risk (05/31/2024)   Patient History    Smoking Tobacco Use: Never    Smokeless Tobacco Use: Never    Passive Exposure: Never  Financial Resource Strain: Not on file  Food Insecurity: Not on file  Transportation Needs: Not on file  Physical Activity: Not on file  Stress: Not on file  Social Connections: Not on file  Intimate Partner Violence: Not on file  Depression (PHQ2-9): Low Risk (05/31/2024)   Depression (PHQ2-9)    PHQ-2 Score: 0  Alcohol Screen: Not on file  Housing: Not on file  Utilities: Not on file  Health Literacy: Not on file    Family History  Problem Relation Age of Onset   Hypertension Mother    Hypertension Father    Heart disease Father    Hypertension Brother    Heart disease Brother    Cancer Maternal Aunt        Pancreatic   Cancer Maternal Aunt        Lung   Cancer Maternal Uncle        Lung   Alzheimer's disease Paternal Grandmother    Colon cancer Neg Hx     No Known Allergies    PE Today's Vitals   05/31/24 1546  BP: 118/68  Pulse: 95  Temp: 98.9 F (37.2 C)  TempSrc: Oral  SpO2: 99%  Weight: 175 lb (79.4 kg)  Height: 5' 5.25 (1.657 m)    Body mass index is 28.9 kg/m.  Physical Exam Vitals reviewed. Exam conducted with a chaperone present.  Constitutional:      General: She is not in acute distress.    Appearance: Normal appearance.  HENT:     Head: Normocephalic and atraumatic.     Nose: Nose normal.  Eyes:     Extraocular Movements: Extraocular movements intact.     Conjunctiva/sclera: Conjunctivae normal.  Pulmonary:     Effort: Pulmonary effort is normal.  Chest:     Chest wall: No mass or tenderness.  Breasts:    Right: Normal. No swelling, mass, nipple discharge or tenderness.     Left: Tenderness (mild, generalized) present. No swelling, mass or nipple discharge.     Comments: Bilateral  breast reduction scars  Abdominal:     General: There is no distension.     Palpations: Abdomen is soft.     Tenderness: There is no abdominal tenderness.  Genitourinary:    General: Normal vulva.     Exam position: Lithotomy position.     Urethra: No prolapse.  Vagina: Normal. No vaginal discharge or bleeding.     Cervix: No lesion.     Adnexa: Right adnexa normal and left adnexa normal.     Comments: Cervix and uterus absent Musculoskeletal:        General: Normal range of motion.  Lymphadenopathy:     Upper Body:     Right upper body: No axillary adenopathy.     Left upper body: No axillary adenopathy.     Lower Body: No right inguinal adenopathy. No left inguinal adenopathy.  Skin:    General: Skin is warm and dry.  Neurological:     General: No focal deficit present.     Mental Status: She is alert.  Psychiatric:        Mood and Affect: Mood normal.        Behavior: Behavior normal.      Assessment and Plan:        Well woman exam with routine gynecological exam Assessment & Plan: Cervical cancer screening: N/A Encouraged annual mammogram screening Colonoscopy UTD DXA repeat at age 11 or sooner if stops HRT Labs and immunizations with her primary Encouraged safe sexual practices as indicated Encouraged healthy lifestyle practices with diet and exercise For patients under 50-70yo, I recommend 1200mg  calcium daily and 600IU of vitamin D daily.    Breast pain, left -     MM 3D DIAGNOSTIC MAMMOGRAM BILATERAL BREAST; Future -     US  LIMITED ULTRASOUND INCLUDING AXILLA LEFT BREAST ; Future  Postmenopausal HRT (hormone replacement therapy) -     Estradiol ; Take 1 tablet (0.5 mg total) by mouth daily.  Dispense: 90 tablet; Refill: 4  SUI (stress urinary incontinence, female) Assessment & Plan: Encouraged Kegels for now, consider PFPT when patient has time   Fecal smearing Assessment & Plan: Recommend increasing daily fiber Recommend pelvic floor PT however  she does not feel she has time at this time Encouraged regular Kegel exercises, core exercises and daily squats Recommend increasing physical activity   Negative depression screening   Jamilet Ambroise LULLA Pa, MD  "

## 2024-05-31 NOTE — Assessment & Plan Note (Signed)
 Encouraged Kegels for now, consider PFPT when patient has time
